# Patient Record
Sex: Female | Born: 1935 | Race: White | Hispanic: No | Smoking: Never smoker
Health system: Southern US, Community
[De-identification: ages and names within clinical notes are randomized; demographics above are authoritative.]

## PROBLEM LIST (undated history)

## (undated) DIAGNOSIS — I10 Essential (primary) hypertension: Secondary | ICD-10-CM

## (undated) DIAGNOSIS — E785 Hyperlipidemia, unspecified: Secondary | ICD-10-CM

## (undated) DIAGNOSIS — C801 Malignant (primary) neoplasm, unspecified: Secondary | ICD-10-CM

## (undated) HISTORY — DX: Hyperlipidemia, unspecified: E78.5

## (undated) HISTORY — PX: KNEE SURGERY: SHX244

## (undated) HISTORY — PX: CARPAL TUNNEL RELEASE: SHX101

## (undated) HISTORY — PX: NEPHRECTOMY: SHX65

## (undated) HISTORY — DX: Essential (primary) hypertension: I10

---

## 2004-12-25 ENCOUNTER — Emergency Department: Payer: Self-pay | Admitting: General Practice

## 2004-12-29 ENCOUNTER — Ambulatory Visit: Payer: Self-pay | Admitting: Urology

## 2006-12-07 IMAGING — CT CT ABD-PELV W/O CM
1 of 2 series · 14 of 32 positions shown, 18 images · non-contrast
Comparison: none

REASON FOR EXAM: (1) abd pain  stone protocol   [HOSPITAL]; (2) abd pain
COMMENTS:

[Series 2: stone · axial · 0.88mm/px · z∈[-528,-72]mm · 14 of 166 slices shown, 18 images]
[im 7/166  soft-tissue]
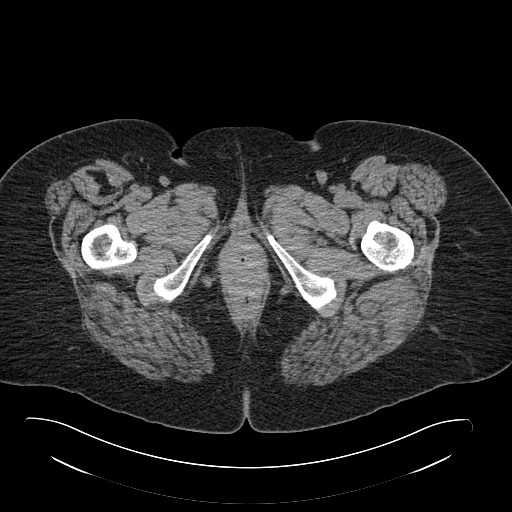
[im 7/166  bone]
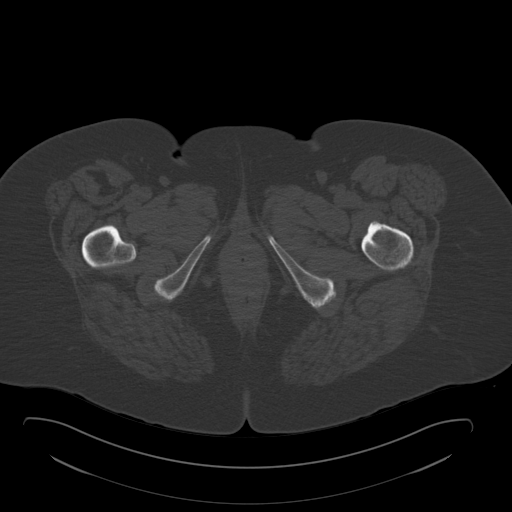
[im 21/166  soft-tissue]
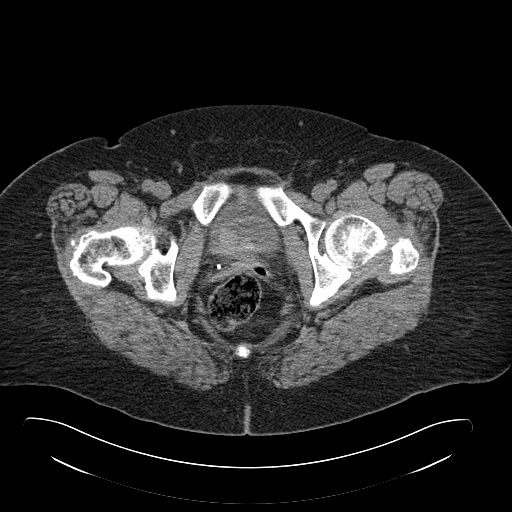
[im 35/166  soft-tissue]
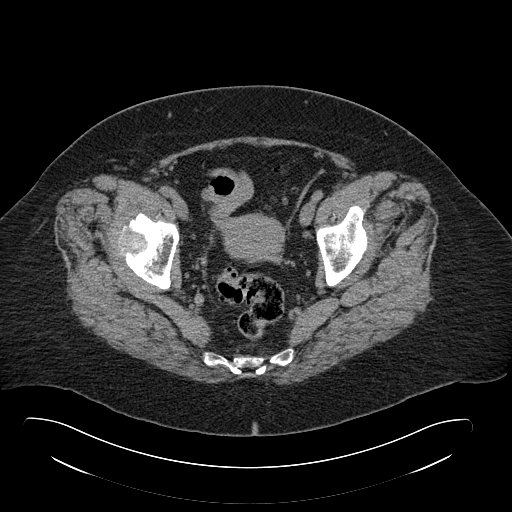
[im 49/166  soft-tissue]
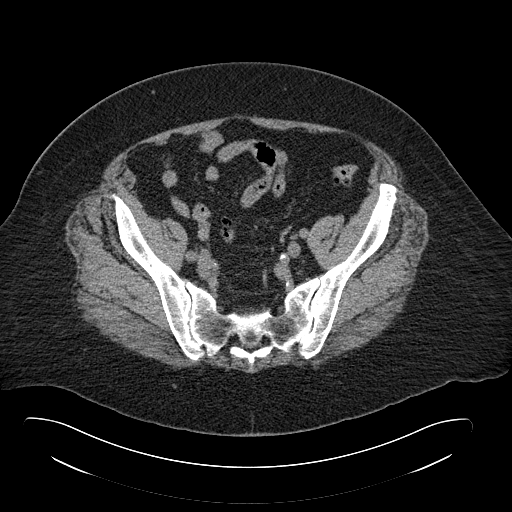
[im 62/166  soft-tissue]
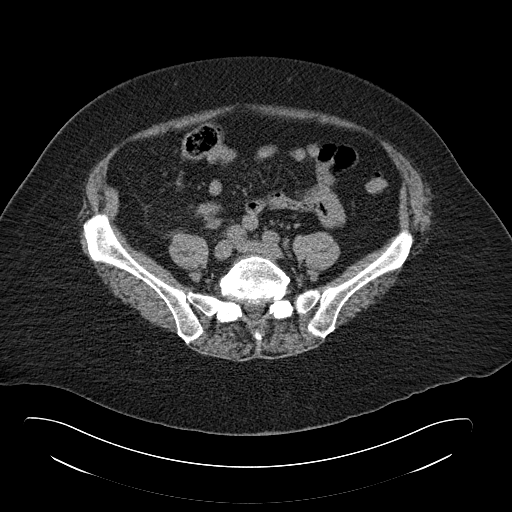
[im 76/166  soft-tissue]
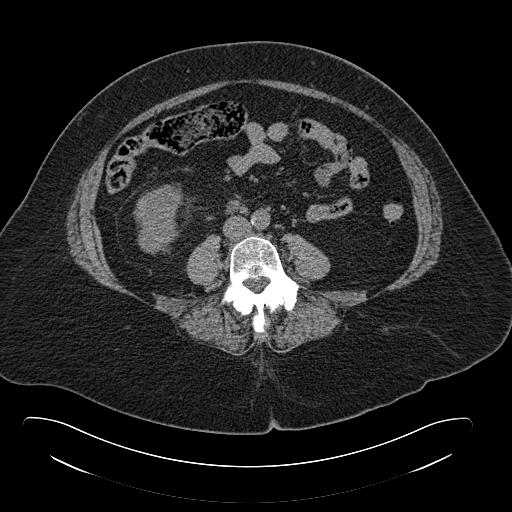
[im 90/166  soft-tissue]
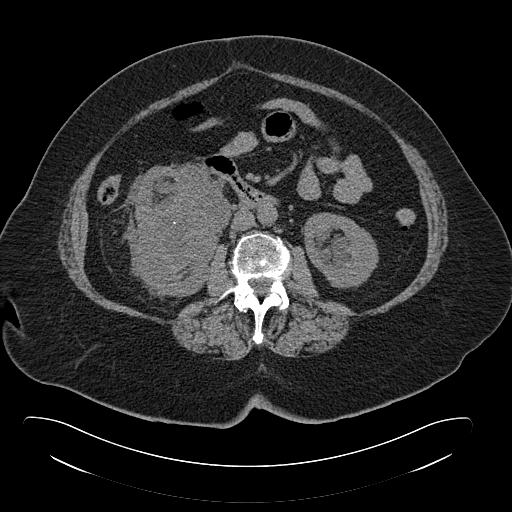
[im 104/166  soft-tissue]
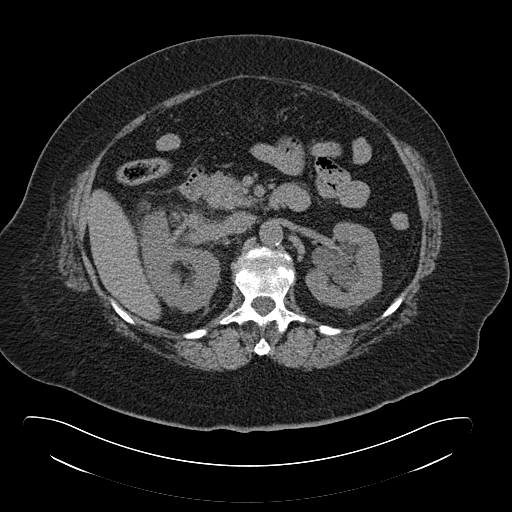
[im 117/166  soft-tissue]
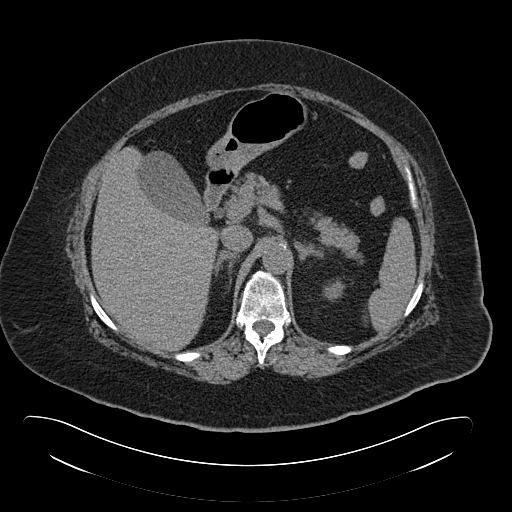
[im 117/166  bone]
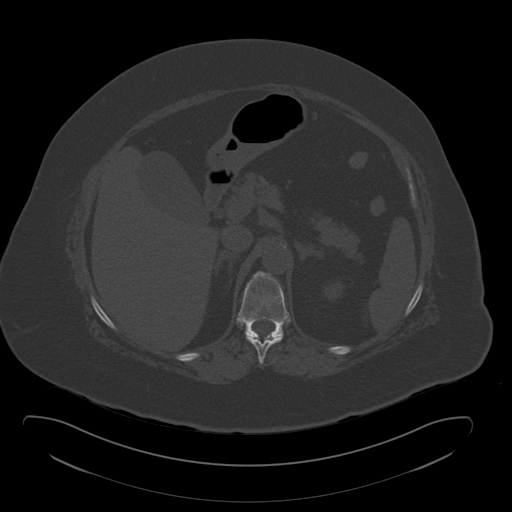
[im 131/166  soft-tissue]
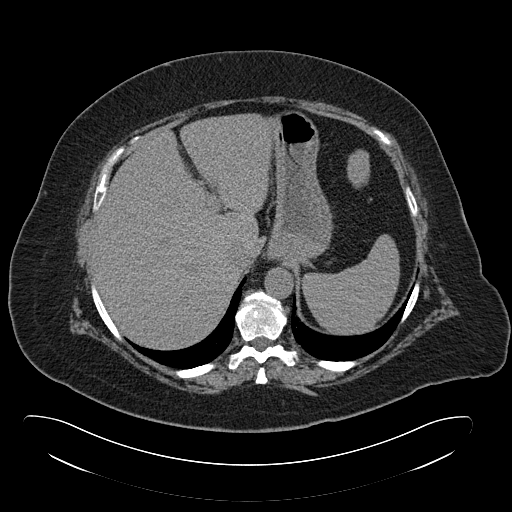
[im 138/166  lung]
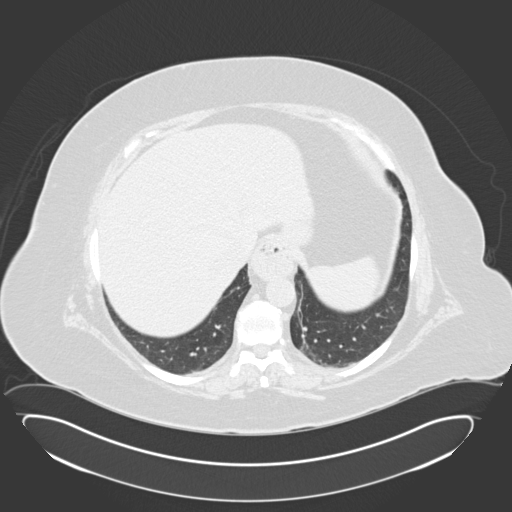
[im 145/166  soft-tissue]
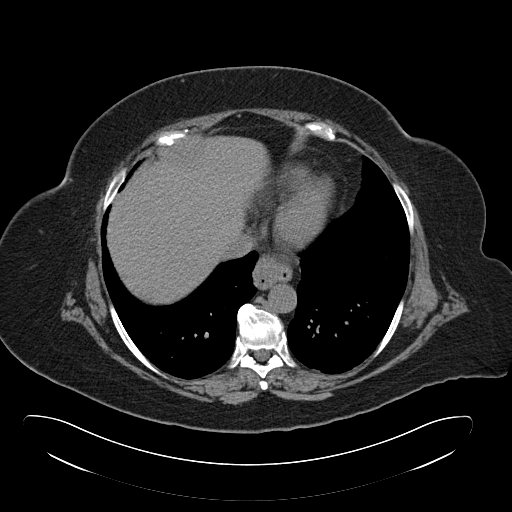
[im 145/166  lung]
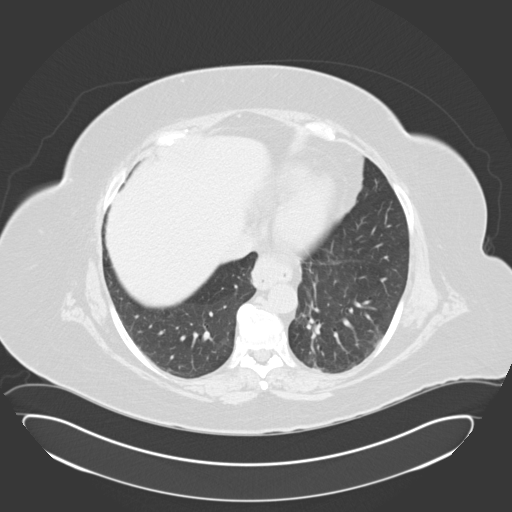
[im 152/166  lung]
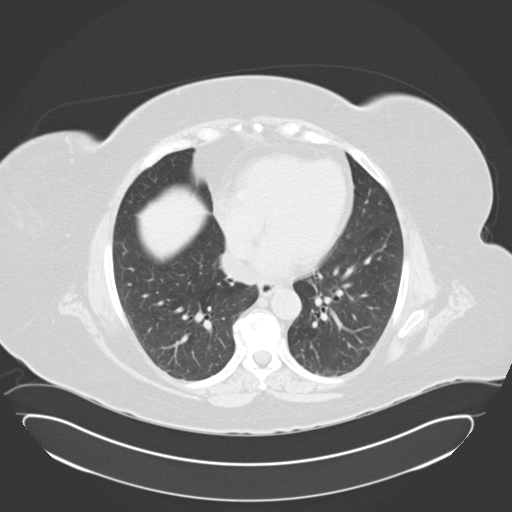
[im 159/166  soft-tissue]
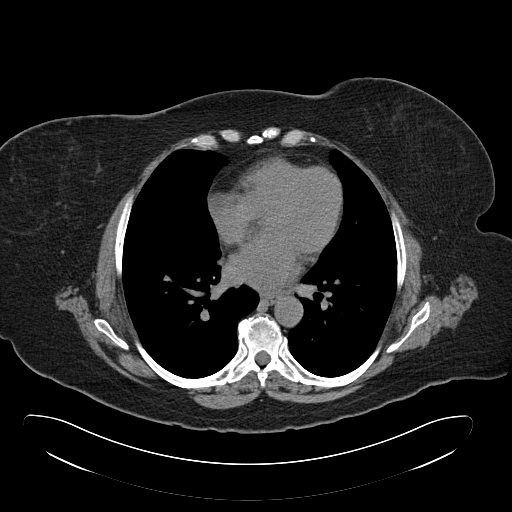
[im 159/166  lung]
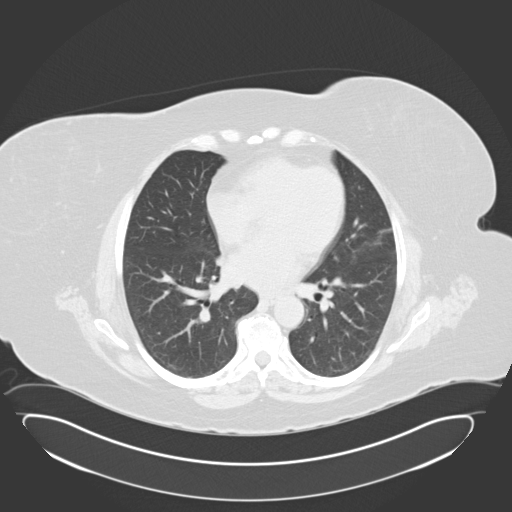

[14 of 32 positions shown; findings below may reference images not displayed]

PROCEDURE:     CT  - CT ABDOMEN AND PELVIS W[DATE]  [DATE]

RESULT:     Non-contrast 3 mm sections were obtained from the lung bases
through the pubic symphysis.

Evaluation of the lung bases demonstrates no evidence of focal infiltrates,
effusions or edema.  Linear areas of increased density project within the
LEFT lung base and paraspinous region likely representing scar or
atelectasis.

Evaluation of the RIGHT kidney demonstrates a large heterogenous mass
extending from the renal pelvis anteriorly. This is in the mid pole region
and this mass appears to measure approximately 8.1 x 9.3 cm in AP by
transverse dimensions. There is mass effect within the renal pelvis as well
as what appears to be moderate hydronephrosis.  A small punctate
calcification is demonstrated within the mass.  The ureter demonstrates no
evidence of hydroureter. There does not appear to be evidence of
ureterolithiasis.  Within the urinary bladder an oblong shaped area of soft
tissue attenuation projects within the bladder suspicious for a mass within
the urinary bladder.  Further evaluation with direct visualization is
recommended.

Evaluation of the LEFT urinary collecting system demonstrates what appears
to be moderate to severe hydronephrosis as well as possibly an element of
peripelvic cyst.  The LEFT ureter demonstrates no evidence of hydroureter.
A distal calcified density projects in the expected course of the LEFT
ureter measuring approximately 4 mm in diameter and suspicious for a distal
LEFT ureteral calculus.  No free fluid nor drainable loculated fluid
collections are demonstrated within the abdomen or pelvis. There is
inflammatory change surrounding the previously described mass within the
RIGHT kidney.  Small retroperitoneal lymph nodes are identified in the
periaortic region.

Non-contrast evaluation of the liver, spleen, pancreas, adrenals are
unremarkable. Evaluation of the pelvis also demonstrates diffuse
diverticulosis within the sigmoid colon without evidence of diverticulitis.
IMPRESSION: 1)Large heterogenous soft tissue mass projecting in the anterior mid pole
region of the RIGHT kidney worrisome for a neoplastic disease until proven
otherwise. Urological consultation is recommended.

2)Soft tissue attenuation projecting in the region of the urinary bladder
also suspicious for a region of neoplastic disease.

3)Distal LEFT ureteral calculus with what appears to be associated moderate
to mildly severe hydronephrosis.

4)There appears to be an area of intermediate attenuation projecting in the
urinary bladder suspicious for a mass.  Further evaluation with direct
visualization is recommended as well as urological consultation for the
previously described findings within the kidneys.

Dr. Broder of the Emergency Department was informed of these findings at
the time of the initial interpretation.

## 2008-10-09 ENCOUNTER — Ambulatory Visit: Payer: Self-pay | Admitting: Unknown Physician Specialty

## 2008-10-15 ENCOUNTER — Ambulatory Visit: Payer: Self-pay | Admitting: Unknown Physician Specialty

## 2010-11-23 ENCOUNTER — Ambulatory Visit: Payer: Self-pay | Admitting: Specialist

## 2010-12-02 ENCOUNTER — Ambulatory Visit: Payer: Self-pay | Admitting: Specialist

## 2011-05-05 ENCOUNTER — Ambulatory Visit: Payer: Self-pay | Admitting: Family Medicine

## 2011-08-14 ENCOUNTER — Ambulatory Visit: Payer: Self-pay | Admitting: General Practice

## 2011-08-14 LAB — URINALYSIS, COMPLETE
Blood: NEGATIVE
Ketone: NEGATIVE
Protein: NEGATIVE
RBC,UR: 1 /HPF (ref 0–5)
Specific Gravity: 1.013 (ref 1.003–1.030)
Squamous Epithelial: <1

## 2011-08-14 LAB — BASIC METABOLIC PANEL
Calcium, Total: 9.4 mg/dL (ref 8.5–10.1)
Chloride: 106 mmol/L (ref 98–107)
Co2: 26 mmol/L (ref 21–32)
EGFR (African American): >60
EGFR (Non-African Amer.): 54 — ABNORMAL LOW
Glucose: 101 mg/dL — ABNORMAL HIGH (ref 65–99)
Potassium: 4.3 mmol/L (ref 3.5–5.1)

## 2011-08-14 LAB — APTT: Activated PTT: 30.1 secs (ref 23.6–35.9)

## 2011-08-14 LAB — CBC
HCT: 38.7 % (ref 35.0–47.0)
MCHC: 33.6 g/dL (ref 32.0–36.0)
Platelet: 168 10*3/uL (ref 150–440)
RDW: 13.3 % (ref 11.5–14.5)
WBC: 6.7 10*3/uL (ref 3.6–11.0)

## 2011-08-14 LAB — PROTIME-INR: Prothrombin Time: 12.1 secs (ref 11.5–14.7)

## 2011-08-14 LAB — SEDIMENTATION RATE: Erythrocyte Sed Rate: 13 mm/hr (ref 0–30)

## 2011-08-16 LAB — URINE CULTURE

## 2011-08-28 ENCOUNTER — Inpatient Hospital Stay: Payer: Self-pay

## 2011-08-29 LAB — BASIC METABOLIC PANEL
Anion Gap: 8 (ref 7–16)
BUN: 14 mg/dL (ref 7–18)
Calcium, Total: 8.4 mg/dL — ABNORMAL LOW (ref 8.5–10.1)
Co2: 24 mmol/L (ref 21–32)
Creatinine: 1.06 mg/dL (ref 0.60–1.30)
EGFR (African American): 58 — ABNORMAL LOW
Glucose: 163 mg/dL — ABNORMAL HIGH (ref 65–99)
Osmolality: 282 (ref 275–301)
Sodium: 139 mmol/L (ref 136–145)

## 2011-08-29 LAB — HEMOGLOBIN: HGB: 10.9 g/dL — ABNORMAL LOW (ref 12.0–16.0)

## 2011-08-30 LAB — BASIC METABOLIC PANEL
Anion Gap: 9 (ref 7–16)
BUN: 11 mg/dL (ref 7–18)
Co2: 23 mmol/L (ref 21–32)
EGFR (African American): >60
EGFR (Non-African Amer.): 57 — ABNORMAL LOW
Osmolality: 273 (ref 275–301)

## 2011-08-30 LAB — HEMOGLOBIN: HGB: 10.4 g/dL — ABNORMAL LOW (ref 12.0–16.0)

## 2011-08-30 LAB — PLATELET COUNT: Platelet: 123 10*3/uL — ABNORMAL LOW (ref 150–440)

## 2012-07-03 LAB — TSH: TSH: 1.48 u[IU]/mL (ref <=5.90)

## 2012-10-05 ENCOUNTER — Encounter (HOSPITAL_COMMUNITY): Payer: Self-pay | Admitting: Emergency Medicine

## 2012-10-05 ENCOUNTER — Emergency Department (HOSPITAL_COMMUNITY): Payer: Medicare Other

## 2012-10-05 ENCOUNTER — Inpatient Hospital Stay (HOSPITAL_COMMUNITY)
Admission: EM | Admit: 2012-10-05 | Discharge: 2012-10-07 | DRG: 563 | Disposition: A | Discharge status: Home / Self Care | Payer: Medicare Other | Attending: General Surgery | Admitting: General Surgery

## 2012-10-05 DIAGNOSIS — Z6841 Body Mass Index (BMI) 40.0 and over, adult: Secondary | ICD-10-CM

## 2012-10-05 DIAGNOSIS — M25552 Pain in left hip: Secondary | ICD-10-CM

## 2012-10-05 DIAGNOSIS — Z905 Acquired absence of kidney: Secondary | ICD-10-CM

## 2012-10-05 DIAGNOSIS — S7000XA Contusion of unspecified hip, initial encounter: Secondary | ICD-10-CM | POA: Diagnosis present

## 2012-10-05 DIAGNOSIS — M25572 Pain in left ankle and joints of left foot: Secondary | ICD-10-CM | POA: Diagnosis present

## 2012-10-05 DIAGNOSIS — I10 Essential (primary) hypertension: Secondary | ICD-10-CM | POA: Diagnosis present

## 2012-10-05 DIAGNOSIS — Z85528 Personal history of other malignant neoplasm of kidney: Secondary | ICD-10-CM

## 2012-10-05 DIAGNOSIS — S301XXA Contusion of abdominal wall, initial encounter: Secondary | ICD-10-CM

## 2012-10-05 DIAGNOSIS — R55 Syncope and collapse: Secondary | ICD-10-CM

## 2012-10-05 DIAGNOSIS — I951 Orthostatic hypotension: Secondary | ICD-10-CM | POA: Diagnosis present

## 2012-10-05 DIAGNOSIS — R110 Nausea: Secondary | ICD-10-CM

## 2012-10-05 DIAGNOSIS — D62 Acute posthemorrhagic anemia: Secondary | ICD-10-CM | POA: Diagnosis present

## 2012-10-05 DIAGNOSIS — T07XXXA Unspecified multiple injuries, initial encounter: Secondary | ICD-10-CM

## 2012-10-05 DIAGNOSIS — R109 Unspecified abdominal pain: Secondary | ICD-10-CM

## 2012-10-05 DIAGNOSIS — S82899A Other fracture of unspecified lower leg, initial encounter for closed fracture: Principal | ICD-10-CM | POA: Diagnosis present

## 2012-10-05 DIAGNOSIS — R0789 Other chest pain: Secondary | ICD-10-CM

## 2012-10-05 DIAGNOSIS — S2000XA Contusion of breast, unspecified breast, initial encounter: Secondary | ICD-10-CM | POA: Diagnosis present

## 2012-10-05 DIAGNOSIS — S82109A Unspecified fracture of upper end of unspecified tibia, initial encounter for closed fracture: Secondary | ICD-10-CM | POA: Diagnosis present

## 2012-10-05 DIAGNOSIS — S2001XA Contusion of right breast, initial encounter: Secondary | ICD-10-CM | POA: Diagnosis present

## 2012-10-05 DIAGNOSIS — S3011XA Contusion of abdominal wall, initial encounter: Secondary | ICD-10-CM

## 2012-10-05 DIAGNOSIS — C801 Malignant (primary) neoplasm, unspecified: Secondary | ICD-10-CM | POA: Diagnosis present

## 2012-10-05 DIAGNOSIS — E785 Hyperlipidemia, unspecified: Secondary | ICD-10-CM | POA: Diagnosis present

## 2012-10-05 DIAGNOSIS — M25562 Pain in left knee: Secondary | ICD-10-CM | POA: Diagnosis present

## 2012-10-05 DIAGNOSIS — N644 Mastodynia: Secondary | ICD-10-CM | POA: Diagnosis present

## 2012-10-05 HISTORY — DX: Malignant (primary) neoplasm, unspecified: C80.1

## 2012-10-05 LAB — BASIC METABOLIC PANEL
CO2: 21 mEq/L (ref 19–32)
Calcium: 9.4 mg/dL (ref 8.4–10.5)
GFR calc Af Amer: 51 mL/min — ABNORMAL LOW (ref >90)
GFR calc non Af Amer: 44 mL/min — ABNORMAL LOW (ref >90)
Sodium: 137 mEq/L (ref 135–145)

## 2012-10-05 LAB — HEPATIC FUNCTION PANEL
ALT: 22 U/L (ref 0–35)
AST: 31 U/L (ref 0–37)
Total Protein: 5.6 g/dL — ABNORMAL LOW (ref 6.0–8.3)

## 2012-10-05 LAB — CBC
MCH: 30.5 pg (ref 26.0–34.0)
MCHC: 33.9 g/dL (ref 30.0–36.0)
Platelets: 189 10*3/uL (ref 150–400)
RBC: 4.3 MIL/uL (ref 3.87–5.11)
RDW: 13.5 % (ref 11.5–15.5)

## 2012-10-05 LAB — POCT I-STAT TROPONIN I: Troponin i, poc: 0.01 ng/mL (ref 0.00–0.08)

## 2012-10-05 LAB — HEMOGLOBIN AND HEMATOCRIT, BLOOD: HCT: 34.1 % — ABNORMAL LOW (ref 36.0–46.0)

## 2012-10-05 MED ORDER — ACETAMINOPHEN 325 MG PO TABS
650.0000 mg | ORAL_TABLET | Freq: Once | ORAL | Status: AC
  Administered 2012-10-05: 650 mg via ORAL
  Filled 2012-10-05: qty 2

## 2012-10-05 MED ORDER — DIAZEPAM 5 MG PO TABS: ORAL_TABLET | ORAL | Status: DC

## 2012-10-05 MED ORDER — TRAMADOL HCL 50 MG PO TABS: 50.0000 mg | ORAL_TABLET | Freq: Four times a day (QID) | ORAL | Status: DC | PRN

## 2012-10-05 MED ORDER — ONDANSETRON HCL 4 MG/2ML IJ SOLN
4.0000 mg | Freq: Once | INTRAMUSCULAR | Status: AC
  Administered 2012-10-05: 4 mg via INTRAVENOUS
  Filled 2012-10-05: qty 2

## 2012-10-05 MED ORDER — SODIUM CHLORIDE 0.9 % IV BOLUS (SEPSIS)
1000.0000 mL | INTRAVENOUS | Status: AC
  Administered 2012-10-05: 1000 mL via INTRAVENOUS

## 2012-10-05 MED ORDER — ONDANSETRON HCL 4 MG PO TABS: 4.0000 mg | ORAL_TABLET | Freq: Three times a day (TID) | ORAL | Status: DC | PRN

## 2012-10-05 MED ORDER — IOHEXOL 300 MG/ML  SOLN
80.0000 mL | Freq: Once | INTRAMUSCULAR | Status: AC | PRN
  Administered 2012-10-05: 80 mL via INTRAVENOUS

## 2012-10-05 NOTE — ED Notes (Signed)
Taken to CT; BP 88/38; this RN accompanying to CT.

## 2012-10-05 NOTE — ED Notes (Signed)
1920  Pt moved from POD D to room C23.  Introduced self to the pt

## 2012-10-05 NOTE — ED Notes (Signed)
Patient ambulated with assist of nurse and son (uses cane at home and did not have here). Reported her walking is usual for her, knee was more sore than usual, but she felt ok. Assisted her back to bed and when she tried to sit down on the bed, her eyes started staring and rolled up, she stopped talking, and went limp.  This lasted about 30 seconds and then she started talking again and states she feels better now that she is lying down again. BP 96/53 once lying down.

## 2012-10-05 NOTE — ED Notes (Signed)
Returned from [place] at this time. Vitals stable. Pt reports feeling better. (see vitals).
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
## 2012-10-05 NOTE — ED Provider Notes (Signed)
9:06 PM Report called to Dr. Onalee Hua (hospitalist).  She will admit the patient.  Patient remains HD stable.  Dr. Dwain Sarna was informed of the admission and the request for consulting services.    Gerhard Munch, MD 10/05/12 2106

## 2012-10-05 NOTE — ED Notes (Addendum)
Pt reported feeling "like she was going to pass out" and was diaphoretic. Vitals taken with BP 79/61 on monitor and 60/40 manually. Notified [place], [place]. NS bolus started. Report no pain in abdomen or back; only pain reported in right hand where a bruise is located. States chest pain is gone at this time.

## 2012-10-05 NOTE — ED Notes (Signed)
Patient transported to CT 
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
## 2012-10-05 NOTE — ED Notes (Signed)
Taken to CT at this time. 
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
## 2012-10-06 ENCOUNTER — Observation Stay (HOSPITAL_COMMUNITY): Payer: Medicare Other

## 2012-10-06 ENCOUNTER — Encounter (HOSPITAL_COMMUNITY): Payer: Self-pay | Admitting: *Deleted

## 2012-10-06 ENCOUNTER — Other Ambulatory Visit (HOSPITAL_COMMUNITY): Payer: Medicare Other

## 2012-10-06 ENCOUNTER — Inpatient Hospital Stay (HOSPITAL_COMMUNITY): Payer: Medicare Other

## 2012-10-06 DIAGNOSIS — M25569 Pain in unspecified knee: Secondary | ICD-10-CM

## 2012-10-06 DIAGNOSIS — I10 Essential (primary) hypertension: Secondary | ICD-10-CM | POA: Diagnosis present

## 2012-10-06 DIAGNOSIS — S301XXA Contusion of abdominal wall, initial encounter: Secondary | ICD-10-CM

## 2012-10-06 DIAGNOSIS — E785 Hyperlipidemia, unspecified: Secondary | ICD-10-CM | POA: Diagnosis present

## 2012-10-06 DIAGNOSIS — D62 Acute posthemorrhagic anemia: Secondary | ICD-10-CM

## 2012-10-06 DIAGNOSIS — S2001XA Contusion of right breast, initial encounter: Secondary | ICD-10-CM | POA: Diagnosis present

## 2012-10-06 DIAGNOSIS — M25572 Pain in left ankle and joints of left foot: Secondary | ICD-10-CM | POA: Diagnosis present

## 2012-10-06 DIAGNOSIS — M25562 Pain in left knee: Secondary | ICD-10-CM | POA: Diagnosis present

## 2012-10-06 DIAGNOSIS — N6489 Other specified disorders of breast: Secondary | ICD-10-CM

## 2012-10-06 DIAGNOSIS — M25579 Pain in unspecified ankle and joints of unspecified foot: Secondary | ICD-10-CM

## 2012-10-06 LAB — CBC
HCT: 30.2 % — ABNORMAL LOW (ref 36.0–46.0)
Hemoglobin: 10.4 g/dL — ABNORMAL LOW (ref 12.0–15.0)
MCHC: 34.4 g/dL (ref 30.0–36.0)

## 2012-10-06 LAB — BASIC METABOLIC PANEL
BUN: 21 mg/dL (ref 6–23)
CO2: 20 mEq/L (ref 19–32)
GFR calc non Af Amer: 46 mL/min — ABNORMAL LOW (ref >90)
Glucose, Bld: 138 mg/dL — ABNORMAL HIGH (ref 70–99)
Potassium: 4.1 mEq/L (ref 3.5–5.1)

## 2012-10-06 MED ORDER — SODIUM CHLORIDE 0.9 % IV SOLN
INTRAVENOUS | Status: DC
  Administered 2012-10-06: 08:00:00 via INTRAVENOUS

## 2012-10-06 MED ORDER — SODIUM CHLORIDE 0.9 % IJ SOLN: 3.0000 mL | INTRAMUSCULAR | Status: DC | PRN

## 2012-10-06 MED ORDER — SODIUM CHLORIDE 0.9 % IV SOLN: 250.0000 mL | INTRAVENOUS | Status: DC | PRN

## 2012-10-06 MED ORDER — SODIUM CHLORIDE 0.9 % IJ SOLN
3.0000 mL | Freq: Two times a day (BID) | INTRAMUSCULAR | Status: DC
  Administered 2012-10-06: 3 mL via INTRAVENOUS

## 2012-10-06 MED ORDER — SODIUM CHLORIDE 0.9 % IV BOLUS (SEPSIS): 500.0000 mL | Freq: Once | INTRAVENOUS | Status: DC

## 2012-10-06 MED ORDER — ACETAMINOPHEN 325 MG PO TABS
650.0000 mg | ORAL_TABLET | Freq: Four times a day (QID) | ORAL | Status: DC | PRN
  Administered 2012-10-06 – 2012-10-07 (×4): 650 mg via ORAL
  Filled 2012-10-06 (×4): qty 2

## 2012-10-06 MED ORDER — SODIUM BICARBONATE BOLUS VIA INFUSION
Freq: Once | INTRAVENOUS | Status: AC
  Administered 2012-10-06: 10:00:00 via INTRAVENOUS
  Filled 2012-10-06: qty 1

## 2012-10-06 MED ORDER — ACETAMINOPHEN 650 MG RE SUPP: 650.0000 mg | Freq: Four times a day (QID) | RECTAL | Status: DC | PRN

## 2012-10-06 MED ORDER — IOHEXOL 350 MG/ML SOLN
50.0000 mL | Freq: Once | INTRAVENOUS | Status: AC | PRN
  Administered 2012-10-06: 50 mL via INTRAVENOUS

## 2012-10-06 MED ORDER — SODIUM BICARBONATE 8.4 % IV SOLN
50.0000 meq | Freq: Once | INTRAVENOUS | Status: DC
  Filled 2012-10-06: qty 50

## 2012-10-06 MED ORDER — SODIUM BICARBONATE 8.4 % IV SOLN
Freq: Once | INTRAVENOUS | Status: AC
  Administered 2012-10-06: 10:00:00 via INTRAVENOUS
  Filled 2012-10-06: qty 500

## 2012-10-06 NOTE — ED Provider Notes (Signed)
Medical screening examination/treatment/procedure(s) were conducted as a shared visit with non-physician practitioner(s) and myself.  I personally evaluated the patient during the encounter   Rolan Bucco, MD 10/06/12 [phone]
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
## 2012-10-06 NOTE — Progress Notes (Signed)
Orthopedic Tech Progress Note Patient Details:  Sharon Cooper 09/25/1930 [id number]  Ortho Devices Type of Ortho Device: CAM walker;Knee Immobilizer Ortho Device/Splint Interventions: Application   Shawnie Pons 10/06/2012, 3:47 PM
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
## 2012-10-07 LAB — CBC
Hemoglobin: 10.1 g/dL — ABNORMAL LOW (ref 12.0–15.0)
MCH: 30.2 pg (ref 26.0–34.0)
MCV: 90.1 fL (ref 78.0–100.0)
Platelets: 134 10*3/uL — ABNORMAL LOW (ref 150–400)
RBC: 3.34 MIL/uL — ABNORMAL LOW (ref 3.87–5.11)

## 2012-10-07 LAB — BASIC METABOLIC PANEL
CO2: 23 mEq/L (ref 19–32)
Calcium: 8.6 mg/dL (ref 8.4–10.5)
Creatinine, Ser: 0.94 mg/dL (ref 0.50–1.10)
Glucose, Bld: 124 mg/dL — ABNORMAL HIGH (ref 70–99)

## 2012-10-07 MED ORDER — ACETAMINOPHEN 325 MG PO TABS: 650.0000 mg | ORAL_TABLET | Freq: Four times a day (QID) | ORAL | Status: AC | PRN
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
## 2012-10-07 NOTE — Progress Notes (Signed)
HHPT/OT arranged with Genevieve Norlander per patient choice.  Pt needs wheelchair and it was ordered and to be delivered by [facility] to patient room.  Have called DME rep, Jill Alexanders, to let him know pt will need that wheelchair either delivered to pt room before d/c or to home prior to her arrival--was only able to leave a voicemail.  Assigned RN aware of all of this.
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
## 2012-10-07 NOTE — Progress Notes (Signed)
Saw order for wheelchair and went in the patient's room to discuss the wheelchair.  Patient stated she is going to be with [place] with home health and someone is seeing about using the DME company associated with [place].  Currently do not have the size wheelchair requested for patient in our closet.  If order for the wheelchair does go through with [facility], the wheelchair would have to be delivered to the patient's hospital room or to the home.    Orlan Leavens

## 2012-10-07 NOTE — Progress Notes (Signed)
Dr. Ernest Pine is her orthopedic surgeon at [facility] and she would like to go back to see him post-discharge.  He is a member of the [employer] for [employer].  Their number is [phone].  I have arranged for her to see at 1:15 p.m. On Thursday.  She will need her X-rays on a disk sent with her.  This patient has been seen and I agree with the findings and treatment plan.  Sharon Cooper. Gae Bon, MD, FACS [phone] (pager) [phone] (direct pager) Trauma Surgeon
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
## 2013-04-04 ENCOUNTER — Telehealth (HOSPITAL_COMMUNITY): Payer: Self-pay | Admitting: Emergency Medicine

## 2013-04-04 NOTE — Telephone Encounter (Signed)
Talked to Linneus from [facility] services.  She said she needs a face to face paper signed by Dr. Hulen Skains for services rendered.  She had faxed a copy to [place] office but has not received confirmation back.  Gave fax number for trauma office.  Needs to be signed by MD, PA's can not sign it.

## 2013-05-05 ENCOUNTER — Telehealth (HOSPITAL_COMMUNITY): Payer: Self-pay | Admitting: Emergency Medicine

## 2013-05-09 NOTE — Telephone Encounter (Signed)
Addressed by Dennard Schaumann.

## 2014-02-09 LAB — HEPATIC FUNCTION PANEL
ALK PHOS: 87 U/L (ref 25–125)
ALT: 15 U/L (ref 7–35)
AST: 14 U/L (ref 13–35)
BILIRUBIN, TOTAL: 0.4 mg/dL

## 2014-02-09 LAB — LIPID PANEL
CHOLESTEROL: 310 mg/dL — AB (ref 0–200)
HDL: 59 mg/dL (ref 35–70)
LDL CALC: 215 mg/dL
LDl/HDL Ratio: 3.6
Triglycerides: 180 mg/dL — AB (ref 40–160)

## 2014-02-09 LAB — BASIC METABOLIC PANEL
BUN: 16 mg/dL (ref 4–21)
CREATININE: 1.1 mg/dL (ref 0.5–1.1)
Glucose: 108 mg/dL
Potassium: 4.8 mmol/L (ref 3.4–5.3)
SODIUM: 141 mmol/L (ref 137–147)

## 2014-02-09 LAB — CBC AND DIFFERENTIAL
HCT: 40 % (ref 36–46)
HEMOGLOBIN: 13.5 g/dL (ref 12.0–16.0)
Neutrophils Absolute: 67 /uL
Platelets: 188 10*3/uL (ref 150–399)
WBC: 5 10^3/mL

## 2014-07-19 NOTE — Op Note (Signed)
PATIENT NAME:  Sharon Cooper, CHRISLEY MR#:  [id number] DATE OF BIRTH:  23-May-1930  DATE OF PROCEDURE:  08/28/2011  PREOPERATIVE DIAGNOSIS: Degenerative arthrosis of the right knee.   POSTOPERATIVE DIAGNOSIS: Degenerative arthrosis of the right knee.   PROCEDURE PERFORMED: Right total knee arthroplasty using computer-assisted navigation.   SURGEON: Laurice Record. Holley Bouche., MD  ASSISTANT: Vance Peper, PA-C (required to maintain retraction throughout the procedure).   ANESTHESIA: Femoral nerve block and spinal.   ESTIMATED BLOOD LOSS: 50 mL.   FLUIDS REPLACED: 2000 mL of crystalloid.   TOURNIQUET TIME: 107 minutes.   DRAINS: Two medium drains to reinfusion system.   SOFT TISSUE RELEASES: Anterior cruciate ligament, posterior cruciate ligament, deep medial collateral ligament, patellofemoral ligament, and posterolateral corner.  IMPLANTS UTILIZED: DePuy PFC Sigma size 4 posterior stabilized femoral component (cemented), size 4 MBT tendon tibial component (cemented), 38 mm three peg oval dome patella (cemented), and a 12.5 mm stabilized rotating platform polyethylene insert.   INDICATIONS FOR SURGERY: The patient is a 81 year old female who has been seen for complaints of progressive right knee pain with valgus deformity. X-rays demonstrated severe degenerative changes in tricompartmental fashion with valgus deformity. After discussion of the risks and benefits of surgical intervention, the patient expressed her understanding of the risks, benefits and agreed with plans for surgical intervention.   PROCEDURE IN DETAIL: Patient was brought into the Operating Room and, after adequate femoral nerve block and spinal anesthesia was achieved, a tourniquet was placed on patient's upper right thigh. Patient's right knee and leg were cleaned and prepped with alcohol and DuraPrep, draped in the usual sterile fashion. A "timeout" was performed as per usual protocol. The right lower extremity was exsanguinated using  Esmarch, and the tourniquet was inflated to 300 mmHg. Anterior longitudinal incision was made followed by a standard mid vastus approach. A large effusion was evacuated. The deep fibers of the medial collateral ligament were elevated in subperiosteal fashion off of the medial flare of the tibia so as to maintain a continuous soft tissue sleeve. Patella was subluxed laterally and the patellofemoral ligament was incised. Inspection of the knee demonstrated severe degenerative changes with evidence of eburnated bone. Prominent osteophytes were debrided using rongeur. Anterior and posterior cruciate ligaments were excised. Two 4.0 mm Schanz pins were inserted into the femur and into the tibia for attachment of the trackers that were used for computer-assisted navigation. Hip center was identified using circumduction technique. Distal landmarks were mapped using computer. Distal femur and proximal tibia were mapped using computer. Distal femoral cutting guide was positioned using computer-assisted navigation so as to achieve a 5 degrees distal valgus cut. Cut was performed and verified using the computer. Distal femur was sized and it was felt that a size 4 femoral component was appropriate. A size 4 cutting guide was positioned and the anterior cut was performed and verified using the computer. This was followed by completion of the posterior and chamfer cuts. The femoral cutting guide for the central box was then positioned and central box cut was performed. Attention was then directed to the proximal tibia. Medial and lateral menisci were excised. The extramedullary tibial cutting guide was positioned using computer-assisted navigation so as to achieve 0 degree varus valgus alignment and 0 degree posterior slope. Cut was performed and verified using the computer. Proximal tibia was sized and it was felt that a size 4 tibial tray was appropriate. Tibial and femoral trials were inserted followed by insertion of a 10 mm  polyethylene insert.  The knee was felt to be tight laterally. Trial components were removed. The knee was placed in extension and the joint was distracted using Moreland retractors. The posterolateral corner was then carefully released using a combination of electrocautery and Metzenbaum scissors. This allowed for good medial and lateral soft tissue balancing. Trial components were inserted followed by first a 10 mm and subsequently a 12.5 mm polyethylene trial. This allowed for excellent mediolateral soft tissue balancing both in extension and in flexion. Finally, the patella was cut and prepared so as to accommodate a 38 mm three peg oval dome patella. Patellar trial was placed and the knee was placed through a range of motion with excellent patellar tracking appreciated.   Femoral trial was removed. Central post hole for the tibial component was reamed followed by insertion of a keel punch. Tibial trials were then removed. Cut surfaces of bone were irrigated with copious amounts of normal saline with antibiotic solution using pulsatile lavage and then suctioned dry. Polymethyl methacrylate cement was prepared in the usual fashion using a vacuum mixer. Cement was applied to the cut surface of the proximal tibia as well as along the undersurface of a size 4 MBT tibial component. Tibial component was positioned and impacted into place. Excess cement was removed using freer elevators. Cement was then applied to the cut surface of the femur as well as along the posterior flanges of a size 4 posterior stabilized femoral component. Femoral component was positioned and impacted into place. Excess cement was removed using freer elevators. A 12.5 mm polyethylene trial was inserted and the knee was brought into full extension with steady axial compression applied. Finally, cement was applied to the backside of a 38 mm three peg oval dome patella and the patellar component was positioned and patellar clamp applied. Excess  cement was removed using freer elevators.   After adequate curing of cement, tourniquet was deflated after total tourniquet time of 107 minutes. Hemostasis was achieved using electrocautery. The knee was irrigated with copious amounts of normal saline with antibiotic solution using pulsatile lavage and then suctioned dry. The knee was inspected for any residual cement debris. 30 mL of 0.25% Marcaine with epinephrine was injected along the posterior capsule. A 12.5 mm stabilized rotating platform polyethylene insert was inserted and the knee was placed through a range of motion. Excellent mediolateral soft tissue balancing was appreciated both in full extension and in 90 degrees of flexion. Two medium Hemovac drains were placed in the wound bed and brought out through a separate stab incision to be attached to reinfusion system. The medial parapatellar portion of the incision was reapproximated using interrupted sutures of #1 Vicryl. The subcutaneous tissue was approximated in layers using first #0 Vicryl followed by 2-0 Vicryl. Skin was closed with skin staples. A sterile dressing was applied.   Patient tolerated the procedure well. She was transported to the recovery room in stable condition.    ____________________________ Laurice Record. Holley Bouche., MD jph:cms D: 08/28/2011 21:22:07 ET T: 08/29/2011 10:45:05 ET JOB#: [id number]  cc: Laurice Record. Holley Bouche., MD, <Dictator> JAMES P Holley Bouche MD ELECTRONICALLY SIGNED 08/29/2011 20:29

## 2014-07-19 NOTE — Discharge Summary (Signed)
PATIENT NAME:  Sharon Cooper, Sharon Cooper MR#:  [id number] DATE OF BIRTH:  [birth year]  DATE OF ADMISSION:  08/28/2011 DATE OF DISCHARGE:  08/31/2011  ADMITTING DIAGNOSIS: Degenerative arthrosis of the right knee.   DISCHARGE DIAGNOSIS: Degenerative arthrosis of the right knee.   HISTORY: The patient is a pleasant 81 year old who has been followed at [facility] for progression of right knee pain. The patient had reported a five to six-year history of progressive right knee pain. She reported crepitus as well as near giving way of the knee. The patient had localized most of the pain along the lateral aspect of the knee. Her pain was noted to be aggravated with weight-bearing activities. She admits to ambulating with a significant limp despite the use of a cane. She was denying any night pain prior to surgery. She denied any gross locking of the knee. She did describe some pain beginning as sharp and stabbing in nature. There had been no significant improvement in her condition despite activity modification and use of ambulatory aid. She was unable to take anti-inflammatory medications secondary to a right nephrectomy in 2007. The patient states that the pain increased significantly to the point that it was interfering with her activities of daily living. X-rays taken in the clinic showed severe narrowing of the lateral cartilage space with associated valgus alignment. Osteophyte formation as well as subchondral sclerosis was noted. There were degenerative changes to the patellofemoral articulation. After discussion of the risks and benefits of surgical intervention, the patient expressed her understanding of the risks and benefits and agreed for plans for surgical intervention.   PROCEDURE: Right total knee arthroplasty using computer-assisted navigation. Anesthesia was femoral nerve block with spinal. Soft tissue release: Anterior cruciate ligament, posterior cruciate ligament, deep medial collateral ligaments as  well as the patellofemoral ligament and the posterior lateral corner. Implants utilized: DePuy PFC Sigma size 4 posterior stabilized femoral component (cemented), size 4 MBT tibial component (cemented), 38 mm three-pegged oval dome patella (cemented), and a 12.5 mm stabilized rotating platform polyethylene insert.   HOSPITAL COURSE: The patient tolerated the procedure very well. She had no complications. She was then taken to the PAC-U where she was stabilized and then transferred to the Orthopedic floor. She began receiving anticoagulation therapy of Lovenox 40 mg b.i.d. per Anesthesia and Pharmacy protocol. She was fitted with TED stockings bilaterally. These were allowed to be removed one hour per eight-hour shift. The right one was applied on day two following removal of the Hemovac and a dressing change. Her calves have been nontender, free of any evidence of any deep venous thromboses. Negative Homans sign. Her ankles and feet were fitted with the AVI compression foot pump set at 80 mmHg bilaterally. Heels were elevated off the bed using rolled towels.   The patient has denied any chest pain or shortness of breath. Vital signs have been stable. She has been afebrile. Hemodynamically she was stable. No transfusions were given other than the Autovac transfusion given the first six hours postoperatively.   Physical therapy was initiated on day one for gait training and transfers, occupational therapy for activities of daily living and assistive devices. She has progressed very nicely. Upon being discharged, she was ambulating greater than 200 feet. She was able to go up and down four sets of steps. She was independent with bed-to-chair transfers.   The patient's IV, Foley and Hemovac were discontinued on day two along with a dressing change. The Polar Care was reapplied to the surgical leg  to maintain a temperature of 40 to 50 degrees Fahrenheit. The wound was free of any drainage or any signs of  infection.   DISPOSITION: The patient is being discharged to home in improved stable condition.   DISCHARGE INSTRUCTIONS:  1. She will continue with weight-bearing as tolerated.  2. Continue to use a walker until cleared by Physical Therapy to go to a quad cane.  3. She will receive Home Health physical therapy.  4. Continue using TED stockings. These are to be worn during the day but may be removed at night.  5. She is to continue using a Polar Care to maintain a temperature of 40 to 50 degrees Fahrenheit. Recommend that she use this around-the-clock as much as possible until she is seen in the office in two weeks.  6. She is placed on a regular diet.  7. She has a follow-up appointment in the Clinic on June 18th with Vance Peper, PA and with Dr. Skip Estimable on July 16th.  8. She is to call the Clinic sooner if any temperatures of 101.5 or greater or excessive bleeding.  9. She was instructed in wound care.   DISCHARGE MEDICATIONS: She is to resume her regular medication that she was on prior to admission. She was given a prescription for Lovenox 40 mg subcutaneously daily for 14 days, then discontinue and begin taking one 81 mg enteric-coated aspirin per day. Also, she was given a prescription for oxycodone 5 to 10 mg every 4 to 6 hours p.r.n. for pain and Ultram 50 to 100 mg every 4 to 6 hours p.r.n. for pain.   PAST MEDICAL HISTORY:  1. Chickenpox.  2. Cancer of right kidney in 2007. 3. Hypertension.  4. Hyperlipidemia.   ____________________________ Vance Peper, PA jrw:cbb D: 08/31/2011 07:57:30 ET T: 08/31/2011 12:13:41 ET JOB#: [id number]  cc: Vance Peper, PA, <Dictator> Kaysen Deal PA ELECTRONICALLY SIGNED 09/01/2011 8:02

## 2014-08-11 DIAGNOSIS — C649 Malignant neoplasm of unspecified kidney, except renal pelvis: Secondary | ICD-10-CM | POA: Insufficient documentation

## 2014-08-11 DIAGNOSIS — I130 Hypertensive heart and chronic kidney disease with heart failure and stage 1 through stage 4 chronic kidney disease, or unspecified chronic kidney disease: Secondary | ICD-10-CM | POA: Insufficient documentation

## 2014-08-11 DIAGNOSIS — E7800 Pure hypercholesterolemia, unspecified: Secondary | ICD-10-CM | POA: Insufficient documentation

## 2014-08-11 DIAGNOSIS — R739 Hyperglycemia, unspecified: Secondary | ICD-10-CM | POA: Insufficient documentation

## 2014-08-11 DIAGNOSIS — Z87442 Personal history of urinary calculi: Secondary | ICD-10-CM | POA: Insufficient documentation

## 2014-08-11 DIAGNOSIS — E559 Vitamin D deficiency, unspecified: Secondary | ICD-10-CM | POA: Insufficient documentation

## 2014-08-11 DIAGNOSIS — R5383 Other fatigue: Secondary | ICD-10-CM | POA: Insufficient documentation

## 2014-09-06 DIAGNOSIS — Z96651 Presence of right artificial knee joint: Secondary | ICD-10-CM | POA: Insufficient documentation

## 2014-09-06 DIAGNOSIS — Z96659 Presence of unspecified artificial knee joint: Secondary | ICD-10-CM | POA: Insufficient documentation

## 2014-10-26 ENCOUNTER — Ambulatory Visit: Payer: Self-pay | Admitting: Family Medicine

## 2014-11-18 ENCOUNTER — Ambulatory Visit: Payer: Self-pay | Admitting: Family Medicine

## 2014-11-26 ENCOUNTER — Ambulatory Visit (INDEPENDENT_AMBULATORY_CARE_PROVIDER_SITE_OTHER): Payer: Medicare Other | Admitting: Family Medicine

## 2014-11-26 ENCOUNTER — Encounter: Payer: Self-pay | Admitting: Family Medicine

## 2014-11-26 VITALS — BP 146/98 | HR 81 | Temp 97.5°F | Resp 16 | Wt 279.4 lb

## 2014-11-26 DIAGNOSIS — E785 Hyperlipidemia, unspecified: Secondary | ICD-10-CM | POA: Insufficient documentation

## 2014-11-26 DIAGNOSIS — E7800 Pure hypercholesterolemia, unspecified: Secondary | ICD-10-CM

## 2014-11-26 DIAGNOSIS — G5603 Carpal tunnel syndrome, bilateral upper limbs: Secondary | ICD-10-CM | POA: Insufficient documentation

## 2014-11-26 DIAGNOSIS — C649 Malignant neoplasm of unspecified kidney, except renal pelvis: Secondary | ICD-10-CM | POA: Insufficient documentation

## 2014-11-26 DIAGNOSIS — M1990 Unspecified osteoarthritis, unspecified site: Secondary | ICD-10-CM | POA: Insufficient documentation

## 2014-11-26 DIAGNOSIS — I10 Essential (primary) hypertension: Secondary | ICD-10-CM | POA: Insufficient documentation

## 2014-11-26 DIAGNOSIS — G5600 Carpal tunnel syndrome, unspecified upper limb: Secondary | ICD-10-CM | POA: Insufficient documentation

## 2014-11-26 MED ORDER — PRAVASTATIN SODIUM 10 MG PO TABS: 10.0000 mg | ORAL_TABLET | Freq: Every day | ORAL | Status: DC
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
## 2014-12-01 ENCOUNTER — Other Ambulatory Visit: Payer: Self-pay | Admitting: Family Medicine

## 2014-12-01 DIAGNOSIS — E7800 Pure hypercholesterolemia, unspecified: Secondary | ICD-10-CM

## 2014-12-01 MED ORDER — PRAVASTATIN SODIUM 10 MG PO TABS: 10.0000 mg | ORAL_TABLET | Freq: Every day | ORAL | Status: DC

## 2014-12-01 MED ORDER — LISINOPRIL 40 MG PO TABS: 40.0000 mg | ORAL_TABLET | Freq: Every day | ORAL | Status: DC

## 2014-12-01 NOTE — Telephone Encounter (Signed)
Done-aa 

## 2014-12-01 NOTE — Telephone Encounter (Signed)
Pt contacted office for refill request on the following medications:  pravastatin (PRAVACHOL) 10 MG tablet and lisinopril (PRINIVIL,ZESTRIL) 40 MG tablet.  [facility].  (pt does not get her Rx mail order).  JD#[phone]/[id number]

## 2015-02-15 ENCOUNTER — Ambulatory Visit: Payer: Medicare Other | Admitting: Family Medicine

## 2015-04-08 ENCOUNTER — Ambulatory Visit: Payer: Medicare Other | Admitting: Family Medicine

## 2015-04-15 ENCOUNTER — Other Ambulatory Visit: Payer: Self-pay

## 2015-05-26 ENCOUNTER — Encounter: Payer: Medicare Other | Admitting: Family Medicine

## 2015-08-31 DIAGNOSIS — Z96651 Presence of right artificial knee joint: Secondary | ICD-10-CM | POA: Diagnosis not present

## 2015-09-16 ENCOUNTER — Other Ambulatory Visit: Payer: Self-pay | Admitting: Family Medicine

## 2015-12-16 ENCOUNTER — Ambulatory Visit (INDEPENDENT_AMBULATORY_CARE_PROVIDER_SITE_OTHER): Payer: Medicare Other | Admitting: Family Medicine

## 2015-12-16 ENCOUNTER — Encounter: Payer: Self-pay | Admitting: Family Medicine

## 2015-12-16 VITALS — BP 148/80 | HR 88 | Temp 97.8°F | Resp 18 | Wt 281.0 lb

## 2015-12-16 DIAGNOSIS — E7800 Pure hypercholesterolemia, unspecified: Secondary | ICD-10-CM | POA: Diagnosis not present

## 2015-12-16 DIAGNOSIS — R739 Hyperglycemia, unspecified: Secondary | ICD-10-CM

## 2015-12-16 DIAGNOSIS — I10 Essential (primary) hypertension: Secondary | ICD-10-CM | POA: Diagnosis not present

## 2015-12-16 MED ORDER — LISINOPRIL 40 MG PO TABS: 40.0000 mg | ORAL_TABLET | Freq: Every day | ORAL | 3 refills | Status: DC
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
## 2016-01-20 ENCOUNTER — Ambulatory Visit: Payer: No Typology Code available for payment source

## 2016-01-21 DIAGNOSIS — E7800 Pure hypercholesterolemia, unspecified: Secondary | ICD-10-CM | POA: Diagnosis not present

## 2016-01-21 DIAGNOSIS — I10 Essential (primary) hypertension: Secondary | ICD-10-CM | POA: Diagnosis not present

## 2016-01-21 DIAGNOSIS — R739 Hyperglycemia, unspecified: Secondary | ICD-10-CM | POA: Diagnosis not present

## 2016-01-22 LAB — COMPREHENSIVE METABOLIC PANEL
A/G RATIO: 1.6 (ref 1.2–2.2)
ALK PHOS: 96 IU/L (ref 39–117)
ALT: 10 IU/L (ref 0–32)
AST: 11 IU/L (ref 0–40)
Albumin: 4.1 g/dL (ref 3.5–4.7)
BILIRUBIN TOTAL: 0.3 mg/dL (ref 0.0–1.2)
BUN/Creatinine Ratio: 15 (ref 12–28)
BUN: 17 mg/dL (ref 8–27)
CO2: 21 mmol/L (ref 18–29)
CREATININE: 1.1 mg/dL — AB (ref 0.57–1.00)
Calcium: 9.7 mg/dL (ref 8.7–10.3)
Chloride: 102 mmol/L (ref 96–106)
GFR, EST AFRICAN AMERICAN: 54 mL/min/{1.73_m2} — AB (ref >59)
GFR, EST NON AFRICAN AMERICAN: 47 mL/min/{1.73_m2} — AB (ref >59)
GLUCOSE: 118 mg/dL — AB (ref 65–99)
Globulin, Total: 2.6 g/dL (ref 1.5–4.5)
Potassium: 5.2 mmol/L (ref 3.5–5.2)
SODIUM: 142 mmol/L (ref 134–144)
Total Protein: 6.7 g/dL (ref 6.0–8.5)

## 2016-01-22 LAB — CBC WITH DIFFERENTIAL/PLATELET
BASOS: 0 %
Basophils Absolute: 0 10*3/uL (ref 0.0–0.2)
EOS (ABSOLUTE): 0.2 10*3/uL (ref 0.0–0.4)
EOS: 2 %
HEMATOCRIT: 41.5 % (ref 34.0–46.6)
Hemoglobin: 14.2 g/dL (ref 11.1–15.9)
Immature Grans (Abs): 0 10*3/uL (ref 0.0–0.1)
Immature Granulocytes: 0 %
LYMPHS ABS: 1.7 10*3/uL (ref 0.7–3.1)
Lymphs: 24 %
MCH: 30 pg (ref 26.6–33.0)
MCHC: 34.2 g/dL (ref 31.5–35.7)
MCV: 88 fL (ref 79–97)
MONOS ABS: 0.6 10*3/uL (ref 0.1–0.9)
Monocytes: 8 %
NEUTROS ABS: 4.6 10*3/uL (ref 1.4–7.0)
Neutrophils: 66 %
Platelets: 218 10*3/uL (ref 150–379)
RBC: 4.73 x10E6/uL (ref 3.77–5.28)
RDW: 13.1 % (ref 12.3–15.4)
WBC: 7.1 10*3/uL (ref 3.4–10.8)

## 2016-01-22 LAB — LIPID PANEL WITH LDL/HDL RATIO
Cholesterol, Total: 297 mg/dL — ABNORMAL HIGH (ref 100–199)
HDL: 59 mg/dL (ref >39)
LDL Calculated: 202 mg/dL — ABNORMAL HIGH (ref 0–99)
LDL/HDL RATIO: 3.4 ratio — AB (ref 0.0–3.2)
Triglycerides: 182 mg/dL — ABNORMAL HIGH (ref 0–149)
VLDL Cholesterol Cal: 36 mg/dL (ref 5–40)

## 2016-01-22 LAB — HEMOGLOBIN A1C
Est. average glucose Bld gHb Est-mCnc: 128 mg/dL
HEMOGLOBIN A1C: 6.1 % — AB (ref 4.8–5.6)

## 2016-01-22 LAB — TSH: TSH: 2.34 u[IU]/mL (ref 0.450–4.500)

## 2016-01-25 ENCOUNTER — Telehealth: Payer: Self-pay

## 2016-01-25 DIAGNOSIS — E7800 Pure hypercholesterolemia, unspecified: Secondary | ICD-10-CM

## 2016-01-25 MED ORDER — PRAVASTATIN SODIUM 10 MG PO TABS: 10.0000 mg | ORAL_TABLET | Freq: Every day | ORAL | 3 refills | Status: DC

## 2016-01-25 NOTE — Telephone Encounter (Signed)
-----   Message from Jerrol Banana., MD sent at 01/25/2016  7:47 AM EDT ----- Labs stable, lipids high but I think patient has been statin intolerant in the past. Recommend she try  the Metamucil daily

## 2016-08-29 DIAGNOSIS — Z96651 Presence of right artificial knee joint: Secondary | ICD-10-CM | POA: Diagnosis not present

## 2016-08-29 DIAGNOSIS — Z6841 Body Mass Index (BMI) 40.0 and over, adult: Secondary | ICD-10-CM | POA: Diagnosis not present

## 2016-09-11 ENCOUNTER — Other Ambulatory Visit: Payer: Self-pay | Admitting: Family Medicine

## 2016-09-11 DIAGNOSIS — I10 Essential (primary) hypertension: Secondary | ICD-10-CM

## 2016-12-21 ENCOUNTER — Telehealth: Payer: Self-pay | Admitting: Family Medicine

## 2016-12-21 DIAGNOSIS — I10 Essential (primary) hypertension: Secondary | ICD-10-CM

## 2016-12-21 MED ORDER — LISINOPRIL 40 MG PO TABS: 40.0000 mg | ORAL_TABLET | Freq: Every day | ORAL | 0 refills | Status: DC

## 2016-12-21 NOTE — Telephone Encounter (Signed)
Pt needs refill on lisinopril 40 mg.  She as not been in since 9/17.  She cant come in next week but made a future appt.  Her call back is [phone]  Con Memos

## 2016-12-21 NOTE — Telephone Encounter (Signed)
She uses [facility] and has appt on 01/01/17

## 2017-01-01 ENCOUNTER — Ambulatory Visit (INDEPENDENT_AMBULATORY_CARE_PROVIDER_SITE_OTHER): Payer: Medicare Other | Admitting: Family Medicine

## 2017-01-01 VITALS — BP 160/86 | HR 68 | Temp 98.2°F | Resp 18 | Wt 278.0 lb

## 2017-01-01 DIAGNOSIS — R739 Hyperglycemia, unspecified: Secondary | ICD-10-CM

## 2017-01-01 DIAGNOSIS — E7800 Pure hypercholesterolemia, unspecified: Secondary | ICD-10-CM | POA: Diagnosis not present

## 2017-01-01 DIAGNOSIS — M791 Myalgia, unspecified site: Secondary | ICD-10-CM | POA: Diagnosis not present

## 2017-01-01 DIAGNOSIS — E1169 Type 2 diabetes mellitus with other specified complication: Secondary | ICD-10-CM | POA: Diagnosis not present

## 2017-01-01 DIAGNOSIS — E785 Hyperlipidemia, unspecified: Secondary | ICD-10-CM | POA: Diagnosis not present

## 2017-01-01 DIAGNOSIS — I10 Essential (primary) hypertension: Secondary | ICD-10-CM | POA: Diagnosis not present

## 2017-01-01 MED ORDER — PRAVASTATIN SODIUM 10 MG PO TABS: 10.0000 mg | ORAL_TABLET | Freq: Every day | ORAL | 3 refills | Status: DC

## 2017-01-01 MED ORDER — LISINOPRIL 40 MG PO TABS
40.0000 mg | ORAL_TABLET | Freq: Every day | ORAL | 3 refills | Status: DC
Start: 2017-01-01 — End: 2017-10-10

## 2017-01-01 MED ORDER — AMLODIPINE BESYLATE 5 MG PO TABS: 5.0000 mg | ORAL_TABLET | Freq: Every day | ORAL | 1 refills | Status: DC
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
## 2017-02-08 ENCOUNTER — Ambulatory Visit: Payer: Self-pay | Admitting: Family Medicine

## 2017-03-01 ENCOUNTER — Ambulatory Visit: Payer: Self-pay | Admitting: Family Medicine

## 2017-08-23 DIAGNOSIS — H524 Presbyopia: Secondary | ICD-10-CM | POA: Diagnosis not present

## 2017-08-30 DIAGNOSIS — Z96651 Presence of right artificial knee joint: Secondary | ICD-10-CM | POA: Diagnosis not present

## 2017-08-30 DIAGNOSIS — Z6841 Body Mass Index (BMI) 40.0 and over, adult: Secondary | ICD-10-CM | POA: Diagnosis not present

## 2017-08-30 DIAGNOSIS — M5442 Lumbago with sciatica, left side: Secondary | ICD-10-CM | POA: Diagnosis not present

## 2017-10-10 ENCOUNTER — Other Ambulatory Visit: Payer: Self-pay | Admitting: Family Medicine

## 2017-10-10 DIAGNOSIS — I10 Essential (primary) hypertension: Secondary | ICD-10-CM

## 2017-10-29 DIAGNOSIS — M7918 Myalgia, other site: Secondary | ICD-10-CM | POA: Diagnosis not present

## 2017-11-05 ENCOUNTER — Ambulatory Visit: Payer: Medicare Other | Admitting: Family Medicine

## 2017-11-05 ENCOUNTER — Ambulatory Visit
Admission: RE | Admit: 2017-11-05 | Discharge: 2017-11-05 | Disposition: A | Discharge status: Home / Self Care | Payer: Medicare Other | Source: Ambulatory Visit | Attending: Family Medicine | Admitting: Family Medicine

## 2017-11-05 VITALS — BP 158/96 | HR 80 | Temp 98.3°F | Resp 18

## 2017-11-05 DIAGNOSIS — M25551 Pain in right hip: Secondary | ICD-10-CM | POA: Diagnosis not present

## 2017-11-05 DIAGNOSIS — M545 Low back pain: Secondary | ICD-10-CM | POA: Diagnosis not present

## 2017-11-05 DIAGNOSIS — M5136 Other intervertebral disc degeneration, lumbar region: Secondary | ICD-10-CM | POA: Diagnosis not present

## 2017-11-05 DIAGNOSIS — M5441 Lumbago with sciatica, right side: Secondary | ICD-10-CM

## 2017-11-05 DIAGNOSIS — M1710 Unilateral primary osteoarthritis, unspecified knee: Secondary | ICD-10-CM

## 2017-11-05 DIAGNOSIS — I10 Essential (primary) hypertension: Secondary | ICD-10-CM

## 2017-11-05 DIAGNOSIS — Z6841 Body Mass Index (BMI) 40.0 and over, adult: Secondary | ICD-10-CM

## 2017-11-05 MED ORDER — METHYLPREDNISOLONE ACETATE 80 MG/ML IJ SUSP: 80.0000 mg | Freq: Once | INTRAMUSCULAR | Status: DC

## 2017-11-05 MED ORDER — CYCLOBENZAPRINE HCL 5 MG PO TABS: 5.0000 mg | ORAL_TABLET | Freq: Three times a day (TID) | ORAL | 0 refills | Status: DC | PRN

## 2017-11-05 NOTE — Patient Instructions (Signed)
Start Aleve twice daily until appointment.
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
## 2017-11-07 ENCOUNTER — Other Ambulatory Visit: Payer: Self-pay

## 2017-11-07 NOTE — Telephone Encounter (Signed)
-----   Message from Jerrol Banana., MD sent at 11/05/2017  3:29 PM EDT ----- DDD and OA

## 2017-11-07 NOTE — Telephone Encounter (Signed)
Try Tramadol 50mg  q 6 hours prn,#60,1refill.

## 2017-11-07 NOTE — Telephone Encounter (Signed)
Advised patient of results. Patient reports that she is still in pain. She reports that the Flexeril has not helped and requesting something different be called in for pain. Please advise. Thanks!

## 2017-11-08 ENCOUNTER — Other Ambulatory Visit: Payer: Self-pay | Admitting: Family Medicine

## 2017-11-08 MED ORDER — HYDROCODONE-ACETAMINOPHEN 5-325 MG PO TABS: 1.0000 | ORAL_TABLET | Freq: Four times a day (QID) | ORAL | 0 refills | Status: DC | PRN

## 2017-11-08 MED ORDER — TRAMADOL HCL 50 MG PO TABS: 50.0000 mg | ORAL_TABLET | Freq: Four times a day (QID) | ORAL | 1 refills | Status: DC | PRN

## 2017-11-08 NOTE — Telephone Encounter (Signed)
Patient has been on Tramodol, Prednisone and Tylenol at one point or anther.  All for this back pain.  Nothing has given her any relief at all.   Patient was doing PT for the opposite side and felt that it caused this side to start hurting.   She state she can't get any relief and would like to get something for the pain.

## 2017-11-08 NOTE — Telephone Encounter (Signed)
Carefuly try Norco5/325--1/2-1 po q 6 hrs prn,#60. May need f/u here next week and/or referral to pain clinic.

## 2017-11-08 NOTE — Telephone Encounter (Signed)
Pt states she is in a lot pf pain in her hip.  She has taken the tramadol before and it does not help her.  The Flexeril does not help.  She wont be able to see Dr. Marry Guan until Oct.    She was just in Monday to see Dr. Rosanna Randy.  She wants to express how much she is in pain and nothing seems to be helping  Please Advise  CB# [phone]  Thanks  Con Memos

## 2017-11-08 NOTE — Telephone Encounter (Signed)
Medication ordered. Please sign. Thanks!

## 2017-11-19 ENCOUNTER — Ambulatory Visit: Payer: Medicare Other | Admitting: Family Medicine

## 2017-11-19 VITALS — BP 160/90 | HR 92 | Temp 98.0°F | Resp 18 | Wt 264.0 lb

## 2017-11-19 DIAGNOSIS — M545 Low back pain: Secondary | ICD-10-CM | POA: Diagnosis not present

## 2017-11-19 DIAGNOSIS — I10 Essential (primary) hypertension: Secondary | ICD-10-CM | POA: Diagnosis not present

## 2017-11-19 DIAGNOSIS — Z6841 Body Mass Index (BMI) 40.0 and over, adult: Secondary | ICD-10-CM

## 2017-11-19 DIAGNOSIS — G8929 Other chronic pain: Secondary | ICD-10-CM

## 2017-11-19 MED ORDER — METOPROLOL SUCCINATE ER 25 MG PO TB24
12.5000 mg | ORAL_TABLET | Freq: Every day | ORAL | 11 refills | Status: DC
Start: 2017-11-19 — End: 2019-01-28
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
## 2018-01-15 ENCOUNTER — Other Ambulatory Visit: Payer: Self-pay | Admitting: Family Medicine

## 2018-01-15 DIAGNOSIS — I10 Essential (primary) hypertension: Secondary | ICD-10-CM

## 2018-01-16 ENCOUNTER — Ambulatory Visit: Payer: Self-pay | Admitting: Family Medicine

## 2018-01-16 NOTE — Progress Notes (Deleted)
Patient: Sharon Cooper Female    DOB: 05-Jun-1930   84 y.o.   MRN: [id number] Visit Date: 01/16/2018  Today's Provider: Wilhemena Durie, MD   No chief complaint on file.  Subjective:    HPI      Hypertension, follow-up:  BP Readings from Last 3 Encounters:  11/19/17 (!) 160/90  11/05/17 (!) 158/96  01/01/17 (!) 160/86    She was last seen for hypertension 2 months ago.  BP at that visit was 160/90. Management changes since that visit include none. She reports {excellent/good/fair/poor:19665} compliance with treatment. She {ACTION; IS/IS [id number]} having side effects. *** She {is/is not:9024} exercising. She {is/is not:9024} adherent to low salt diet.   Outside blood pressures are ***. She is experiencing {Symptoms; cardiac:12860}.  Patient denies {Symptoms; cardiac:12860}.   Cardiovascular risk factors include {cv risk factors:510}.  Use of agents associated with hypertension: {bp agents assoc with hypertension:511::"none"}.     Weight trend: {trend:16658} Wt Readings from Last 3 Encounters:  11/19/17 264 lb (119.7 kg)  01/01/17 278 lb (126.1 kg)  12/16/15 281 lb (127.5 kg)    Current diet: {diet habits:16563}  ------------------------------------------------------------------------   Allergies  Allergen Reactions  . Lactose Intolerance (Gi)   . Atorvastatin Other (See Comments)    severe muscle pain and joint pain severe muscle pain and joint pain  . Pravastatin Sodium Other (See Comments)    muscle cramps, joint pain muscle cramps, joint pain. Per pt she has tolerated this.      Current Outpatient Medications:  .  acetaminophen (TYLENOL) 325 MG tablet, Take 2 tablets (650 mg total) by mouth every 6 (six) hours as needed., Disp: , Rfl:  .  amLODipine (NORVASC) 5 MG tablet, Take 1 tablet (5 mg total) by mouth daily., Disp: 30 tablet, Rfl: 1 .  aspirin EC 81 MG tablet, Take 81 mg by mouth daily., Disp: , Rfl:  .  Cholecalciferol (VITAMIN  D-3) 1000 UNITS CAPS, Take 1 capsule by mouth daily., Disp: , Rfl:  .  cyanocobalamin 100 MCG tablet, Take 1 tablet by mouth daily., Disp: , Rfl:  .  cyclobenzaprine (FLEXERIL) 5 MG tablet, Take 1 tablet (5 mg total) by mouth 3 (three) times daily as needed for muscle spasms. (Patient not taking: Reported on 11/19/2017), Disp: 90 tablet, Rfl: 0 .  HYDROcodone-acetaminophen (NORCO/VICODIN) 5-325 MG tablet, Take 1 tablet by mouth every 6 (six) hours as needed for moderate pain. (Patient not taking: Reported on 11/19/2017), Disp: 60 tablet, Rfl: 0 .  lisinopril (PRINIVIL,ZESTRIL) 40 MG tablet, TAKE ONE TABLET BY MOUTH EVERY DAY, Disp: 90 tablet, Rfl: 3 .  metoprolol succinate (TOPROL-XL) 25 MG 24 hr tablet, Take 0.5 tablets (12.5 mg total) by mouth daily., Disp: 30 tablet, Rfl: 11 .  Omega-3 Fatty Acids (FISH OIL BURP-LESS) 1000 MG CAPS, Take 1 tablet by mouth daily., Disp: , Rfl:  .  pravastatin (PRAVACHOL) 10 MG tablet, Take 1 tablet (10 mg total) by mouth daily., Disp: 90 tablet, Rfl: 3 .  traMADol (ULTRAM) 50 MG tablet, Take 1 tablet (50 mg total) by mouth every 6 (six) hours as needed. (Patient not taking: Reported on 11/19/2017), Disp: 60 tablet, Rfl: 1  Current Facility-Administered Medications:  .  methylPREDNISolone acetate (DEPO-MEDROL) injection 80 mg, 80 mg, Intramuscular, Once, Jerrol Banana., MD  Review of Systems  Social History   Tobacco Use  . Smoking status: Never Smoker  . Smokeless tobacco: Never Used  Substance Use Topics  .  Alcohol use: No   Objective:   There were no vitals taken for this visit. There were no vitals filed for this visit.   Physical Exam      Assessment & Plan:           Wilhemena Durie, MD  [facility]

## 2018-01-18 ENCOUNTER — Other Ambulatory Visit: Payer: Self-pay | Admitting: Family Medicine

## 2018-01-18 DIAGNOSIS — E7800 Pure hypercholesterolemia, unspecified: Secondary | ICD-10-CM

## 2018-01-18 DIAGNOSIS — E1169 Type 2 diabetes mellitus with other specified complication: Secondary | ICD-10-CM

## 2018-01-18 DIAGNOSIS — E785 Hyperlipidemia, unspecified: Secondary | ICD-10-CM

## 2018-01-30 ENCOUNTER — Ambulatory Visit: Payer: Medicare Other | Admitting: Family Medicine

## 2018-01-30 VITALS — BP 142/80 | HR 80 | Temp 98.1°F | Resp 18 | Wt 268.0 lb

## 2018-01-30 DIAGNOSIS — R5383 Other fatigue: Secondary | ICD-10-CM

## 2018-01-30 DIAGNOSIS — I10 Essential (primary) hypertension: Secondary | ICD-10-CM | POA: Diagnosis not present

## 2018-01-30 DIAGNOSIS — C649 Malignant neoplasm of unspecified kidney, except renal pelvis: Secondary | ICD-10-CM

## 2018-01-30 DIAGNOSIS — M5432 Sciatica, left side: Secondary | ICD-10-CM

## 2018-01-30 DIAGNOSIS — M791 Myalgia, unspecified site: Secondary | ICD-10-CM | POA: Diagnosis not present

## 2018-01-30 DIAGNOSIS — E7800 Pure hypercholesterolemia, unspecified: Secondary | ICD-10-CM | POA: Diagnosis not present

## 2018-01-30 DIAGNOSIS — M5431 Sciatica, right side: Secondary | ICD-10-CM

## 2018-01-30 DIAGNOSIS — M1710 Unilateral primary osteoarthritis, unspecified knee: Secondary | ICD-10-CM

## 2018-01-30 DIAGNOSIS — Z6841 Body Mass Index (BMI) 40.0 and over, adult: Secondary | ICD-10-CM

## 2018-01-30 DIAGNOSIS — E66813 Obesity, class 3: Secondary | ICD-10-CM
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
## 2018-01-31 LAB — COMPREHENSIVE METABOLIC PANEL
A/G RATIO: 1.8 (ref 1.2–2.2)
ALBUMIN: 4.3 g/dL (ref 3.5–4.7)
ALK PHOS: 79 IU/L (ref 39–117)
ALT: 9 IU/L (ref 0–32)
AST: 12 IU/L (ref 0–40)
BUN / CREAT RATIO: 21 (ref 12–28)
BUN: 24 mg/dL (ref 8–27)
CHLORIDE: 104 mmol/L (ref 96–106)
CO2: 18 mmol/L — AB (ref 20–29)
CREATININE: 1.16 mg/dL — AB (ref 0.57–1.00)
Calcium: 9.6 mg/dL (ref 8.7–10.3)
GFR calc Af Amer: 50 mL/min/{1.73_m2} — ABNORMAL LOW (ref >59)
GFR calc non Af Amer: 43 mL/min/{1.73_m2} — ABNORMAL LOW (ref >59)
GLOBULIN, TOTAL: 2.4 g/dL (ref 1.5–4.5)
Glucose: 77 mg/dL (ref 65–99)
POTASSIUM: 4 mmol/L (ref 3.5–5.2)
SODIUM: 141 mmol/L (ref 134–144)
Total Protein: 6.7 g/dL (ref 6.0–8.5)

## 2018-01-31 LAB — CBC WITH DIFFERENTIAL/PLATELET
Basophils Absolute: 0 10*3/uL (ref 0.0–0.2)
Basos: 1 %
EOS (ABSOLUTE): 0.2 10*3/uL (ref 0.0–0.4)
EOS: 3 %
HEMATOCRIT: 41.1 % (ref 34.0–46.6)
Hemoglobin: 13.6 g/dL (ref 11.1–15.9)
Immature Grans (Abs): 0 10*3/uL (ref 0.0–0.1)
Immature Granulocytes: 0 %
Lymphocytes Absolute: 1.6 10*3/uL (ref 0.7–3.1)
Lymphs: 20 %
MCH: 29.5 pg (ref 26.6–33.0)
MCHC: 33.1 g/dL (ref 31.5–35.7)
MCV: 89 fL (ref 79–97)
Monocytes Absolute: 0.7 10*3/uL (ref 0.1–0.9)
Monocytes: 8 %
Neutrophils Absolute: 5.5 10*3/uL (ref 1.4–7.0)
Neutrophils: 68 %
Platelets: 190 10*3/uL (ref 150–450)
RBC: 4.61 x10E6/uL (ref 3.77–5.28)
RDW: 12.8 % (ref 12.3–15.4)
WBC: 8 10*3/uL (ref 3.4–10.8)

## 2018-01-31 LAB — LIPID PANEL WITH LDL/HDL RATIO
CHOLESTEROL TOTAL: 247 mg/dL — AB (ref 100–199)
HDL: 59 mg/dL (ref >39)
LDL Calculated: 135 mg/dL — ABNORMAL HIGH (ref 0–99)
LDl/HDL Ratio: 2.3 ratio (ref 0.0–3.2)
TRIGLYCERIDES: 267 mg/dL — AB (ref 0–149)
VLDL Cholesterol Cal: 53 mg/dL — ABNORMAL HIGH (ref 5–40)

## 2018-01-31 LAB — CK: CK TOTAL: 75 U/L (ref 24–173)

## 2018-01-31 LAB — TSH: TSH: 1.33 u[IU]/mL (ref 0.450–4.500)

## 2018-02-04 ENCOUNTER — Telehealth: Payer: Self-pay

## 2018-02-04 NOTE — Telephone Encounter (Signed)
-----   Message from Jerrol Banana., MD sent at 02/02/2018 11:38 AM EST ----- Labs okay.  Lipids better.

## 2018-02-04 NOTE — Telephone Encounter (Signed)
Tried calling patient, no answer. Will try again later.  

## 2018-02-06 NOTE — Telephone Encounter (Signed)
Advised patient of results.  

## 2018-07-24 ENCOUNTER — Ambulatory Visit: Payer: Self-pay | Admitting: Family Medicine

## 2018-09-03 DIAGNOSIS — M1711 Unilateral primary osteoarthritis, right knee: Secondary | ICD-10-CM | POA: Diagnosis not present

## 2018-09-03 DIAGNOSIS — Z96651 Presence of right artificial knee joint: Secondary | ICD-10-CM | POA: Diagnosis not present

## 2019-01-27 ENCOUNTER — Other Ambulatory Visit: Payer: Self-pay | Admitting: Family Medicine

## 2019-01-27 DIAGNOSIS — I10 Essential (primary) hypertension: Secondary | ICD-10-CM

## 2019-01-27 NOTE — Telephone Encounter (Signed)
Pt contacted office for refill request on the following medications:  1. lisinopril (PRINIVIL,ZESTRIL) 40 MG tablet 2. metoprolol succinate (TOPROL-XL) 25 MG 24 hr tablet  [facility] Please advise. Thanks TNP

## 2019-01-27 NOTE — Telephone Encounter (Signed)
patient has not had visit since 01/30/18, left message for patient to call back to schedule follow up visit prior to refill. KW

## 2019-01-28 MED ORDER — METOPROLOL SUCCINATE ER 25 MG PO TB24: 12.5000 mg | ORAL_TABLET | Freq: Every day | ORAL | 0 refills | Status: DC

## 2019-01-28 MED ORDER — LISINOPRIL 40 MG PO TABS: 40.0000 mg | ORAL_TABLET | Freq: Every day | ORAL | 0 refills | Status: DC

## 2019-01-28 NOTE — Telephone Encounter (Signed)
She can have 1 month of refills--thx

## 2019-01-28 NOTE — Telephone Encounter (Signed)
Virtual visit or phone visit ok for now.

## 2019-01-28 NOTE — Telephone Encounter (Signed)
You do not have any openings until 11/30. Would you like to work the patient in? Patient will run out of medication before then. Please advise. Thanks!

## 2019-01-28 NOTE — Telephone Encounter (Signed)
The patient was contacted and is completely out of her Metoprolol and has 1 week of her lisinopril left. She is refusing to come in to the office due to fear of getting sick with Covid and was very Upset about being asked to come in with fears of her age and getting the Virus. Please advise if she is able to do a virtual visit for this.

## 2019-02-24 ENCOUNTER — Encounter: Payer: Self-pay | Admitting: Family Medicine

## 2019-02-24 ENCOUNTER — Ambulatory Visit (INDEPENDENT_AMBULATORY_CARE_PROVIDER_SITE_OTHER): Payer: Medicare Other | Admitting: Family Medicine

## 2019-02-24 VITALS — Ht 66.0 in | Wt 269.0 lb

## 2019-02-24 DIAGNOSIS — E7800 Pure hypercholesterolemia, unspecified: Secondary | ICD-10-CM

## 2019-02-24 DIAGNOSIS — Z6841 Body Mass Index (BMI) 40.0 and over, adult: Secondary | ICD-10-CM

## 2019-02-24 DIAGNOSIS — I10 Essential (primary) hypertension: Secondary | ICD-10-CM

## 2019-02-24 DIAGNOSIS — M1710 Unilateral primary osteoarthritis, unspecified knee: Secondary | ICD-10-CM

## 2019-02-24 NOTE — Progress Notes (Signed)
Patient: Sharon Cooper Female    DOB: Jan 27, 1931   85 y.o.   MRN: [id number] Visit Date: 02/24/2019  Today's Provider: Wilhemena Durie, MD   Chief Complaint  Patient presents with  . Medication Refill   Subjective:     Virtual Visit via Telephone Note  I connected with Wilhelmina Mcardle on 02/24/19 at  3:40 PM EST by telephone and verified that I am speaking with the correct person using two identifiers.  Location: Patient: Home Provider: Office   I discussed the limitations, risks, security and privacy concerns of performing an evaluation and management service by telephone and the availability of in person appointments. I also discussed with the patient that there may be a patient responsible charge related to this service. The patient expressed understanding and agreed to proceed. Patient is feeling well.  She is walking around the house with a walker.  She is not really leaving her house.  Her son mainly takes care of her and her daughter is available for her.  She is having no complaints other than arthritic pains.  She is having no falls.  No chest pain or shortness of breath.  She is taking her meds daily and taking her blood pressure once or twice a week.  Usually runs 140s over 70s HPI  Allergies  Allergen Reactions  . Lactose Intolerance (Gi)   . Atorvastatin Other (See Comments)    severe muscle pain and joint pain severe muscle pain and joint pain  . Milk Protein Diarrhea  . Pravastatin Sodium Other (See Comments)    muscle cramps, joint pain muscle cramps, joint pain. Per pt she has tolerated this.      Current Outpatient Medications:  .  amLODipine (NORVASC) 5 MG tablet, Take 1 tablet (5 mg total) by mouth daily., Disp: 30 tablet, Rfl: 1 .  Ascorbic Acid (VITAMIN C) 100 MG tablet, Take 100 mg by mouth daily., Disp: , Rfl:  .  aspirin EC 81 MG tablet, Take 81 mg by mouth daily., Disp: , Rfl:  .  Cholecalciferol (VITAMIN D-3) 1000 UNITS CAPS, Take 1  capsule by mouth daily., Disp: , Rfl:  .  lisinopril (ZESTRIL) 40 MG tablet, Take 1 tablet (40 mg total) by mouth daily., Disp: 30 tablet, Rfl: 0 .  metoprolol succinate (TOPROL-XL) 25 MG 24 hr tablet, Take 0.5 tablets (12.5 mg total) by mouth daily., Disp: 30 tablet, Rfl: 0 .  acetaminophen (TYLENOL) 325 MG tablet, Take 2 tablets (650 mg total) by mouth every 6 (six) hours as needed., Disp: , Rfl:  .  cyanocobalamin 100 MCG tablet, Take 1 tablet by mouth daily., Disp: , Rfl:  .  Omega-3 Fatty Acids (FISH OIL BURP-LESS) 1000 MG CAPS, Take 1 tablet by mouth daily., Disp: , Rfl:  .  pravastatin (PRAVACHOL) 10 MG tablet, TAKE ONE TABLET BY MOUTH EVERY DAY (Patient not taking: Reported on 02/24/2019), Disp: 90 tablet, Rfl: 1  Current Facility-Administered Medications:  .  methylPREDNISolone acetate (DEPO-MEDROL) injection 80 mg, 80 mg, Intramuscular, Once, Jerrol Banana., MD  Review of Systems  Constitutional: Negative.   Eyes: Negative.   Respiratory: Negative.   Cardiovascular: Negative.   Endocrine: Negative.   Musculoskeletal: Positive for arthralgias.  Allergic/Immunologic: Negative.   Psychiatric/Behavioral: Negative.     Social History   Tobacco Use  . Smoking status: Never Smoker  . Smokeless tobacco: Never Used  Substance Use Topics  . Alcohol use: No  Objective:   Ht 5\' 6"  (1.676 m)   Wt 269 lb (122 kg)   BMI 43.42 kg/m  Vitals:   02/24/19 1541  Weight: 269 lb (122 kg)  Height: 5\' 6"  (1.676 m)  Body mass index is 43.42 kg/m.   Physical Exam   No results found for any visits on 02/24/19.     Assessment & Plan     1. Essential hypertension Fairly well controlled.  Patient does not want to make any adjustments now.  I would increase her atenolol if need be in the future.  2. Primary osteoarthritis of knee, unspecified laterality Patient walking with a walker and is steady with no falls.  3. Hypercholesteremia On pravastatin.  We will recheck  labs on next visit  4. Class 3 severe obesity due to excess calories with serious comorbidity and body mass index (BMI) of 45.0 to 49.9 in adult [facility]) With hypertension arthritis and hyperlipidemia  I discussed the assessment and treatment plan with the patient. The patient was provided an opportunity to ask questions and all were answered. The patient agreed with the plan and demonstrated an understanding of the instructions.   The patient was advised to call back or seek an in-person evaluation if the symptoms worsen or if the condition fails to improve as anticipated.  I provided 11 minutes of non-face-to-face time during this encounter.      Richard Cranford Mon, MD  [facility]

## 2019-02-25 ENCOUNTER — Telehealth: Payer: Self-pay

## 2019-02-25 MED ORDER — METOPROLOL SUCCINATE ER 25 MG PO TB24: 12.5000 mg | ORAL_TABLET | Freq: Every day | ORAL | 5 refills | Status: DC

## 2019-02-25 MED ORDER — LISINOPRIL 40 MG PO TABS: 40.0000 mg | ORAL_TABLET | Freq: Every day | ORAL | 0 refills | Status: DC

## 2019-02-25 MED ORDER — AMLODIPINE BESYLATE 5 MG PO TABS: 5.0000 mg | ORAL_TABLET | Freq: Every day | ORAL | 5 refills | Status: DC

## 2019-02-25 NOTE — Telephone Encounter (Signed)
Copied from [place] [id number]. Topic: Appointment Scheduling - Scheduling Inquiry for Clinic >> Feb 24, 2019  5:08 PM Percell Belt A wrote: Reason for CRM: pt called in about 5pm and state that she was suppose to have a visit with Dr Rosanna Randy today but she never rec'd a call.  She would like to know what happened or she is confused as to what happen with the visit ?  Best number [phone]

## 2019-02-25 NOTE — Telephone Encounter (Signed)
Done

## 2019-04-17 ENCOUNTER — Other Ambulatory Visit: Payer: Self-pay | Admitting: Family Medicine

## 2019-04-17 DIAGNOSIS — E1169 Type 2 diabetes mellitus with other specified complication: Secondary | ICD-10-CM

## 2019-04-17 DIAGNOSIS — E7800 Pure hypercholesterolemia, unspecified: Secondary | ICD-10-CM

## 2019-04-17 DIAGNOSIS — I10 Essential (primary) hypertension: Secondary | ICD-10-CM

## 2019-04-17 MED ORDER — METOPROLOL SUCCINATE ER 25 MG PO TB24: 12.5000 mg | ORAL_TABLET | Freq: Every day | ORAL | 1 refills | Status: DC

## 2019-04-17 MED ORDER — LISINOPRIL 40 MG PO TABS: 40.0000 mg | ORAL_TABLET | Freq: Every day | ORAL | 1 refills | Status: DC

## 2019-04-17 MED ORDER — PRAVASTATIN SODIUM 10 MG PO TABS: 10.0000 mg | ORAL_TABLET | Freq: Every day | ORAL | 3 refills | Status: DC

## 2019-04-17 NOTE — Telephone Encounter (Signed)
Medication Refill - Medication:  lisinopril (ZESTRIL) 40 MG tablet [id number]  metoprolol succinate (TOPROL-XL) 25 MG 24 hr tablet pravastatin (PRAVACHOL) 10 MG tablet [id number]   Has the patient contacted their pharmacy? No. (Agent: If no, request that the patient contact the pharmacy for the refill.) (Agent: If yes, when and what did the pharmacy advise?)  Preferred Pharmacy (with phone number or street name): [facility]   Agent: Please be advised that RX refills may take up to 3 business days. We ask that you follow-up with your pharmacy.

## 2019-04-18 ENCOUNTER — Telehealth: Payer: Self-pay

## 2019-04-18 NOTE — Telephone Encounter (Signed)
Copied from [place] [phone]. Topic: General - Call Back - No Documentation >> Apr 18, 2019  4:06 PM Sharon Cooper wrote: Reason for CRM:   Pt states she is returning a missed call from the office.

## 2019-04-21 NOTE — Telephone Encounter (Signed)
No documentation of anyone in office calling patient.

## 2019-08-28 DIAGNOSIS — Z96651 Presence of right artificial knee joint: Secondary | ICD-10-CM | POA: Diagnosis not present

## 2019-10-08 ENCOUNTER — Other Ambulatory Visit: Payer: Self-pay | Admitting: Family Medicine

## 2019-10-08 DIAGNOSIS — I10 Essential (primary) hypertension: Secondary | ICD-10-CM

## 2019-10-18 IMAGING — CR DG LUMBAR SPINE COMPLETE 4+V
1 series · 5 of 5 positions shown · non-contrast
Comparison: CT abdomen pelvis 10/05/2012

CLINICAL DATA: Acute low back pain

EXAM:
LUMBAR SPINE - COMPLETE 4+ VIEW

[Series 1: dg lumbar spine complete 4 +v · 0.14mm/px · 5 of 5 slices shown]
[im 1/5]
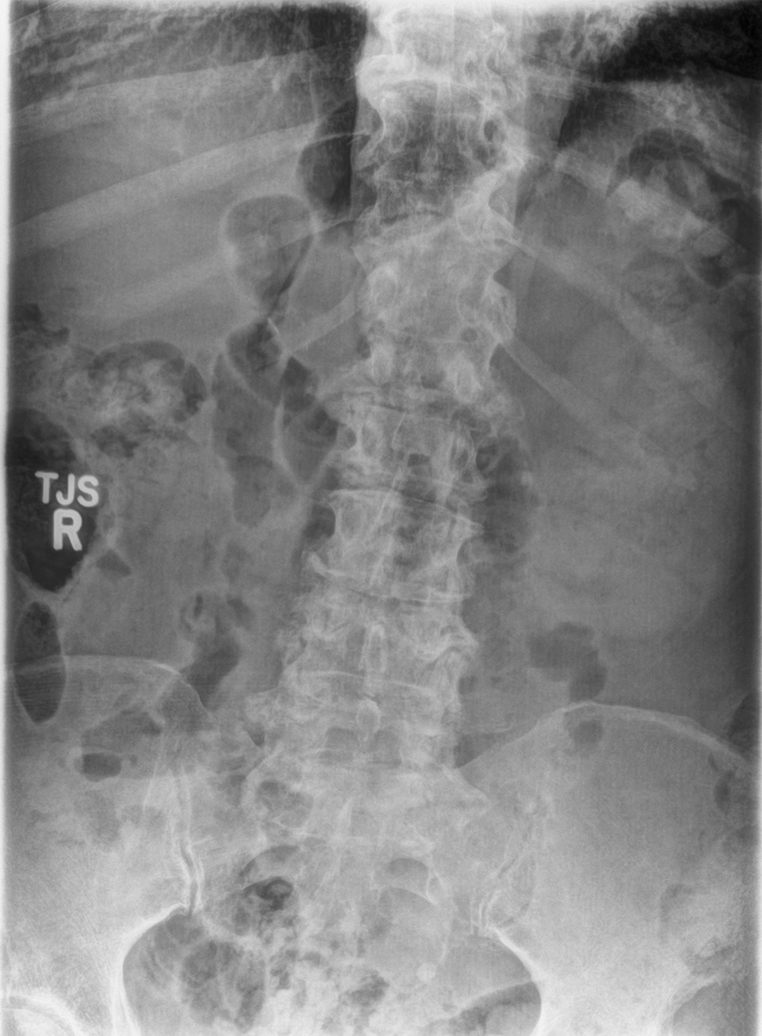
[im 2/5]
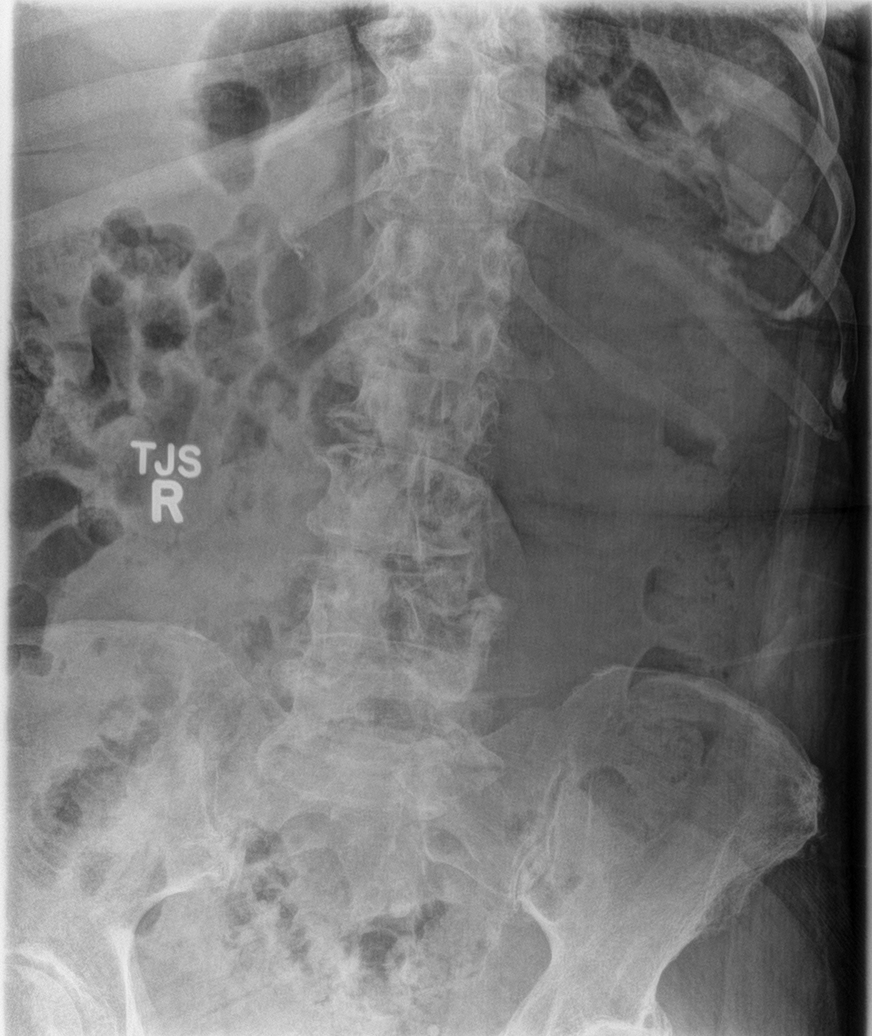
[im 3/5]
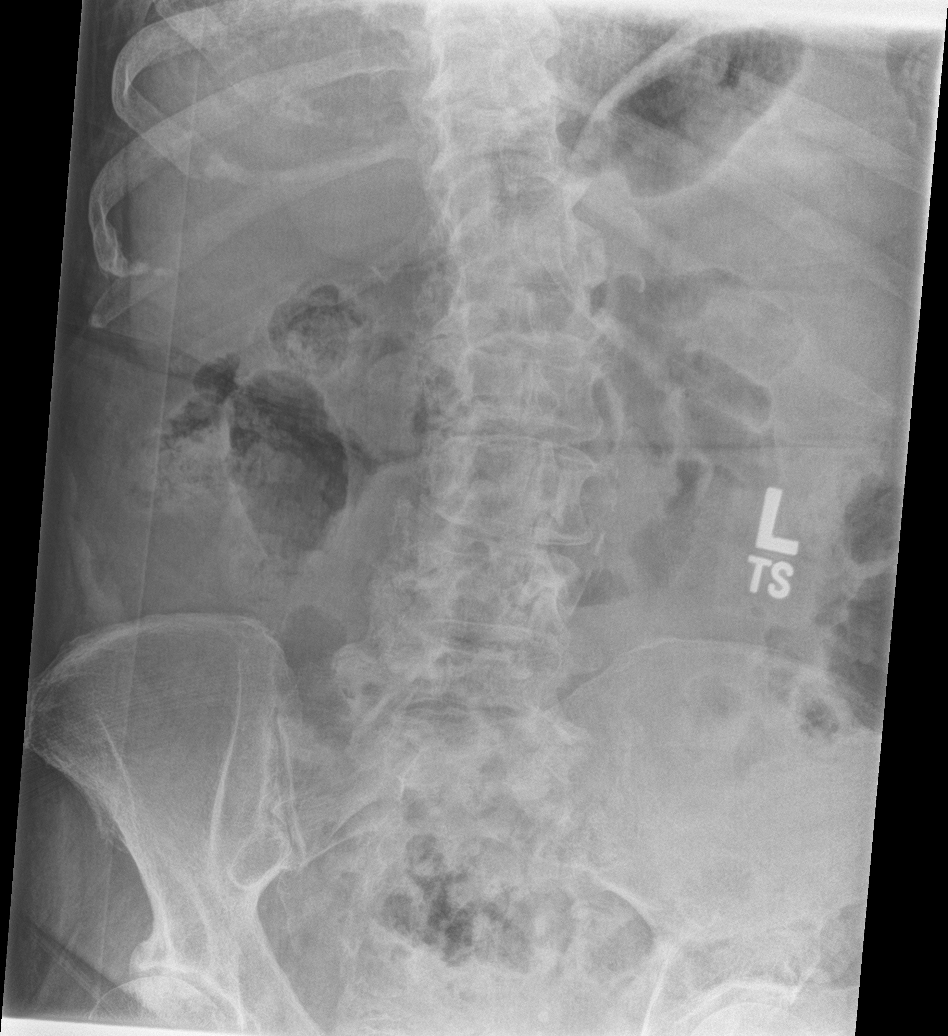
[im 4/5]
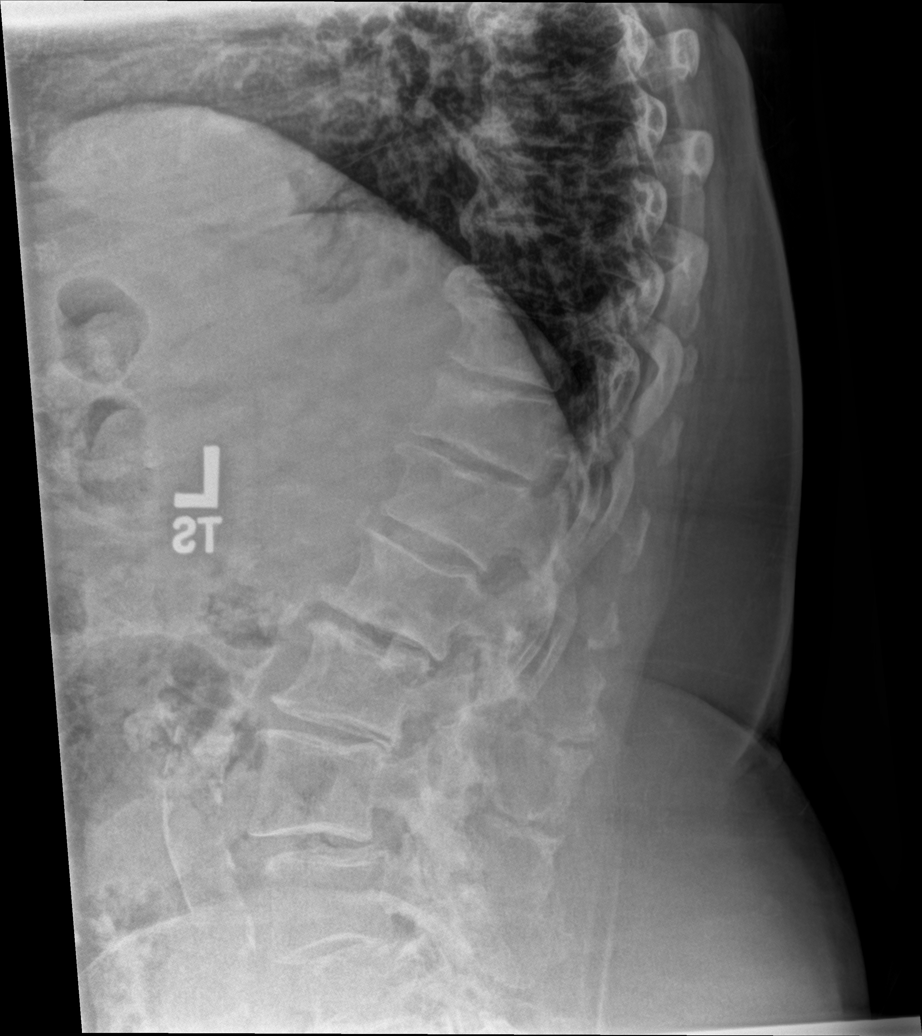
[im 5/5]
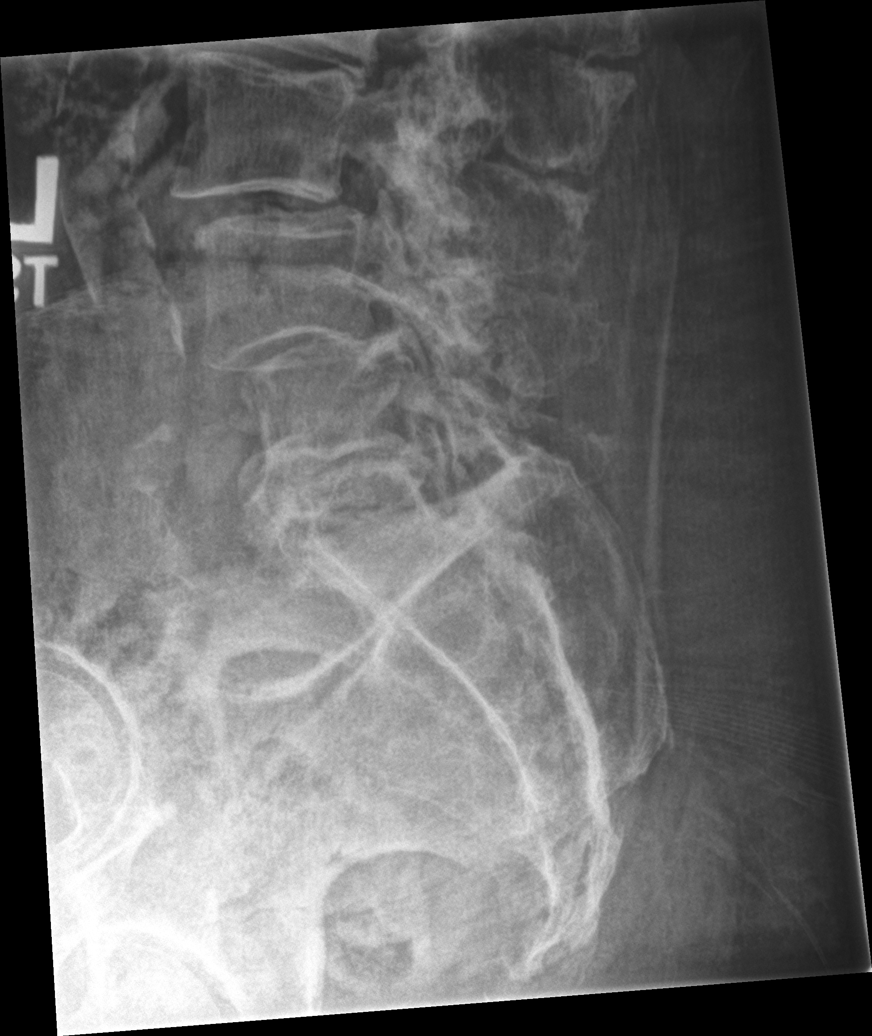

[5 of 5 positions shown; findings below may reference images not displayed]

FINDINGS: Grade 1 anterolisthesis L3-4 L4-5 has progressed. Slight
retrolisthesis L1-2 and L2-3 also has progressed.

Progressive multilevel disc degeneration with disc space narrowing
and spurring throughout the lumbar spine most notably L1-2 and L2-3.
Lumbar facet degeneration.

Negative for fracture or mass.

Atherosclerotic aorta
IMPRESSION: Progression of multilevel disc and facet degeneration throughout the
lumbar spine. Negative for fracture.

## 2019-11-13 NOTE — Progress Notes (Signed)
Subjective:   Sharon Cooper is a 86 y.o. female who presents for Medicare Annual (Subsequent) preventive examination.  I connected with Merton Border today by telephone and verified that I am speaking with the correct person using two identifiers. Location patient: home Location provider: work Persons participating in the virtual visit: patient, provider.   I discussed the limitations, risks, security and privacy concerns of performing an evaluation and management service by telephone and the availability of in person appointments. I also discussed with the patient that there may be a patient responsible charge related to this service. The patient expressed understanding and verbally consented to this telephonic visit.    Interactive audio and video telecommunications were attempted between this provider and patient, however failed, due to patient having technical difficulties OR patient did not have access to video capability.  We continued and completed visit with audio only.   Review of Systems    N/A  Cardiac Risk Factors include: advanced age (>23men, >46 women);obesity (BMI >30kg/m2);dyslipidemia;hypertension     Objective:    There were no vitals filed for this visit. There is no height or weight on file to calculate BMI.  Advanced Directives 11/17/2019 10/06/2012  Does Patient Have a Medical Advance Directive? Yes Patient has advance directive, copy not in chart  Type of Advance Directive [place];Living will -  Copy of [place] in Chart? No - copy requested Copy requested from family  Pre-existing out of facility DNR order (yellow form or pink MOST form) - No    Current Medications (verified) Outpatient Encounter Medications as of 11/17/2019  Medication Sig  . acetaminophen (TYLENOL) 325 MG tablet Take 2 tablets (650 mg total) by mouth every 6 (six) hours as needed.  Marland Kitchen amLODipine (NORVASC) 5 MG tablet Take 1 tablet (5 mg total) by  mouth daily.  Marland Kitchen aspirin EC 81 MG tablet Take 81 mg by mouth daily.  . Cholecalciferol (VITAMIN D-3) 1000 UNITS CAPS Take 1 capsule by mouth daily.  . cyanocobalamin 100 MCG tablet Take 1 tablet by mouth daily.  Marland Kitchen lisinopril (ZESTRIL) 40 MG tablet TAKE 1 TABLET BY MOUTH DAILY  . metoprolol succinate (TOPROL-XL) 25 MG 24 hr tablet Take 0.5 tablets (12.5 mg total) by mouth daily.  . Omega-3 Fatty Acids (FISH OIL BURP-LESS) 1000 MG CAPS Take 1 tablet by mouth daily.  . pravastatin (PRAVACHOL) 10 MG tablet Take 1 tablet (10 mg total) by mouth daily.  . Ascorbic Acid (VITAMIN C) 100 MG tablet Take 100 mg by mouth daily. (Patient not taking: Reported on 11/17/2019)   Facility-Administered Encounter Medications as of 11/17/2019  Medication  . methylPREDNISolone acetate (DEPO-MEDROL) injection 80 mg    Allergies (verified) Lactose intolerance (gi), Atorvastatin, Milk protein, and Pravastatin sodium   History: Past Medical History:  Diagnosis Date  . Cancer ([place])    Right kidney removed  . Hyperlipidemia   . Hypertension    Past Surgical History:  Procedure Laterality Date  . CARPAL TUNNEL RELEASE    . KNEE SURGERY Right   . NEPHRECTOMY Right    metastatic cancer  . NEPHRECTOMY     Family History  Problem Relation Age of Onset  . Diabetes Son    Social History   Socioeconomic History  . Marital status: Widowed    Spouse name: Not on file  . Number of children: 2  . Years of education: Not on file  . Highest education level: High school graduate  Occupational History  .  Occupation: retired  Tobacco Use  . Smoking status: Never Smoker  . Smokeless tobacco: Never Used  Vaping Use  . Vaping Use: Never used  Substance and Sexual Activity  . Alcohol use: No  . Drug use: No  . Sexual activity: Never    Birth control/protection: Abstinence  Other Topics Concern  . Not on file  Social History Narrative  . Not on file   Social Determinants of Health   Financial Resource  Strain: Low Risk   . Difficulty of Paying Living Expenses: Not hard at all  Food Insecurity: No Food Insecurity  . Worried About Charity fundraiser in the Last Year: Never true  . Ran Out of Food in the Last Year: Never true  Transportation Needs: No Transportation Needs  . Lack of Transportation (Medical): No  . Lack of Transportation (Non-Medical): No  Physical Activity: Inactive  . Days of Exercise per Week: 0 days  . Minutes of Exercise per Session: 0 min  Stress: No Stress Concern Present  . Feeling of Stress : Only a little  Social Connections: Socially Isolated  . Frequency of Communication with Friends and Family: More than three times a week  . Frequency of Social Gatherings with Friends and Family: Patient refused  . Attends Religious Services: Never  . Active Member of Clubs or Organizations: No  . Attends Archivist Meetings: Never  . Marital Status: Widowed    Tobacco Counseling Counseling given: Not Answered   Clinical Intake:  Pre-visit preparation completed: Yes  Pain : No/denies pain     Nutritional Risks: None Diabetes: No  How often do you need to have someone help you when you read instructions, pamphlets, or other written materials from your doctor or pharmacy?: 1 - Never  Diabetic? No  Interpreter Needed?: No  Information entered by :: [facility], LPN   Activities of Daily Living In your present state of health, do you have any difficulty performing the following activities: 11/17/2019 02/24/2019  Hearing? Tempie Donning  Comment Wears bilateral hearing aids. -  Vision? N N  Difficulty concentrating or making decisions? N N  Walking or climbing stairs? Y Y  Comment Due to weakness. -  Dressing or bathing? N N  Doing errands, shopping? N Y  Conservation officer, nature and eating ? N -  Using the Toilet? N -  In the past six months, have you accidently leaked urine? N -  Do you have problems with loss of bowel control? N -  Managing your Medications? Y  -  Comment Grandaughter assists with medications. -  Managing your Finances? N -  Housekeeping or managing your Housekeeping? N -  Some recent data might be hidden    Patient Care Team: Jerrol Banana., MD as PCP - General (Family Medicine) [place], [facility] (Optometry) [place], Laurice Record, MD (Orthopedic Surgery)  Indicate any recent Medical Services you may have received from other than [facility] providers in the past year (date may be approximate).     Assessment:   This is a routine wellness examination for Sharon Cooper.  Hearing/Vision screen No exam data present  Dietary issues and exercise activities discussed: Current Exercise Habits: The patient does not participate in regular exercise at present, Exercise limited by: orthopedic condition(s)  Goals    . DIET - INCREASE WATER INTAKE     Recommend to drink at least 6-8 8oz glasses of water per day.      Depression Screen PHQ 2/9 Scores 02/24/2019  01/01/2017 12/16/2015  PHQ - 2 Score 0 0 0  PHQ- 9 Score 0 0 -    Fall Risk Fall Risk  11/17/2019 01/01/2017 12/16/2015  Falls in the past year? 0 No No  Number falls in past yr: 0 - -  Injury with Fall? 0 - -    Any stairs in or around the home? Yes  If so, are there any without handrails? No  Home free of loose throw rugs in walkways, pet beds, electrical cords, etc? Yes  Adequate lighting in your home to reduce risk of falls? Yes   ASSISTIVE DEVICES UTILIZED TO PREVENT FALLS:  Life alert? No  Use of a cane, walker or w/c? Yes  Grab bars in the bathroom? Yes  Shower chair or bench in shower? Yes  Elevated toilet seat or a handicapped toilet? Yes     Cognitive Function: Declined today.         Immunizations Immunization History  Administered Date(s) Administered  . Tdap 02/04/2014  . Zoster 06/28/2007    TDAP status: Up to date Flu Vaccine status: Declined, Education has been provided regarding the importance of this vaccine but patient still declined.  Advised may receive this vaccine at local pharmacy or Health Dept. Aware to provide a copy of the vaccination record if obtained from local pharmacy or Health Dept. Verbalized acceptance and understanding. Pneumococcal vaccine status: Declined,  Education has been provided regarding the importance of this vaccine but patient still declined. Advised may receive this vaccine at local pharmacy or Health Dept. Aware to provide a copy of the vaccination record if obtained from local pharmacy or Health Dept. Verbalized acceptance and understanding.  Covid-19 vaccine status: Completed vaccines  Qualifies for Shingles Vaccine? Yes   Zostavax completed Yes   Shingrix Completed?: No.    Education has been provided regarding the importance of this vaccine. Patient has been advised to call insurance company to determine out of pocket expense if they have not yet received this vaccine. Advised may also receive vaccine at local pharmacy or Health Dept. Verbalized acceptance and understanding.  Screening Tests Health Maintenance  Topic Date Due  . COVID-19 Vaccine (1) Never done  . INFLUENZA VACCINE  06/24/2020 (Originally 10/26/2019)  . DEXA SCAN  11/16/2020 (Originally 11/11/1997)  . PNA vac Low Risk Adult (1 of 2 - PCV13) 11/16/2020 (Originally 11/11/1997)  . TETANUS/TDAP  02/05/2024    Health Maintenance  Health Maintenance Due  Topic Date Due  . COVID-19 Vaccine (1) Never done    Colorectal cancer screening: No longer required.  Mammogram status: No longer required.  Bone Density status: Currently due. Declined order.   Lung Cancer Screening: (Low Dose CT Chest recommended if Age 28-80 years, 30 pack-year currently smoking OR have quit w/in 15years.) does not qualify.    Additional Screening:  Vision Screening: Recommended annual ophthalmology exams for early detection of glaucoma and other disorders of the eye. Is the patient up to date with their annual eye exam?  Yes  Who is the provider or  what is the name of the office in which the patient attends annual eye exams? [facility] If pt is not established with a provider, would they like to be referred to a provider to establish care? No .   Dental Screening: Recommended annual dental exams for proper oral hygiene  Community Resource Referral / Chronic Care Management: CRR required this visit?  No   CCM required this visit?  No  Plan:     I have personally reviewed and noted the following in the patient's chart:   . Medical and social history . Use of alcohol, tobacco or illicit drugs  . Current medications and supplements . Functional ability and status . Nutritional status . Physical activity . Advanced directives . List of other physicians . Hospitalizations, surgeries, and ER visits in previous 12 months . Vitals . Screenings to include cognitive, depression, and falls . Referrals and appointments  In addition, I have reviewed and discussed with patient certain preventive protocols, quality metrics, and best practice recommendations. A written personalized care plan for preventive services as well as general preventive health recommendations were provided to patient.     Sharon Cooper [place], [place]   [id number]/[id number]   Nurse Notes: Declined a future DEXA order, pneumonia shot or flu shot.

## 2019-11-17 ENCOUNTER — Other Ambulatory Visit: Payer: Self-pay

## 2019-11-17 ENCOUNTER — Ambulatory Visit (INDEPENDENT_AMBULATORY_CARE_PROVIDER_SITE_OTHER): Payer: Medicare Other

## 2019-11-17 DIAGNOSIS — E7800 Pure hypercholesterolemia, unspecified: Secondary | ICD-10-CM

## 2019-11-17 DIAGNOSIS — Z0000 Encounter for general adult medical examination without abnormal findings: Secondary | ICD-10-CM | POA: Diagnosis not present

## 2019-11-17 DIAGNOSIS — E1169 Type 2 diabetes mellitus with other specified complication: Secondary | ICD-10-CM

## 2019-11-17 DIAGNOSIS — I10 Essential (primary) hypertension: Secondary | ICD-10-CM

## 2019-11-17 NOTE — Patient Instructions (Signed)
Sharon Cooper , Thank you for taking time to come for your Medicare Wellness Visit. I appreciate your ongoing commitment to your health goals. Please review the following plan we discussed and let me know if I can assist you in the future.   Screening recommendations/referrals: Colonoscopy: No longer required.  Mammogram: No longer required.  Bone Density: Currently due. Declined a future order.  Recommended yearly ophthalmology/optometry visit for glaucoma screening and checkup Recommended yearly dental visit for hygiene and checkup  Vaccinations: Influenza vaccine: Due fall 2021 Pneumococcal vaccine: Pneumovax 23 due Tdap vaccine: Up to date, due 01/2024 Shingles vaccine: Shingrix discussed. Please contact your pharmacy for coverage information.     Advanced directives: Please bring a copy of your POA (Power of Attorney) and/or Living Will to your next appointment.   Conditions/risks identified: Recommend to drink at least 6-8 8oz glasses of water per day.  Next appointment: None- declined scheduling a follow up with PCP or an AWV for 2022 at this time.    Preventive Care 84 Years and Older, Female Preventive care refers to lifestyle choices and visits with your health care provider that can promote health and wellness. What does preventive care include?  A yearly physical exam. This is also called an annual well check.  Dental exams once or twice a year.  Routine eye exams. Ask your health care provider how often you should have your eyes checked.  Personal lifestyle choices, including:  Daily care of your teeth and gums.  Regular physical activity.  Eating a healthy diet.  Avoiding tobacco and drug use.  Limiting alcohol use.  Practicing safe sex.  Taking low-dose aspirin every day.  Taking vitamin and mineral supplements as recommended by your health care provider. What happens during an annual well check? The services and screenings done by your health care  provider during your annual well check will depend on your age, overall health, lifestyle risk factors, and family history of disease. Counseling  Your health care provider may ask you questions about your:  Alcohol use.  Tobacco use.  Drug use.  Emotional well-being.  Home and relationship well-being.  Sexual activity.  Eating habits.  History of falls.  Memory and ability to understand (cognition).  Work and work Statistician.  Reproductive health. Screening  You may have the following tests or measurements:  Height, weight, and BMI.  Blood pressure.  Lipid and cholesterol levels. These may be checked every 5 years, or more frequently if you are over 21 years old.  Skin check.  Lung cancer screening. You may have this screening every year starting at age 31 if you have a 30-pack-year history of smoking and currently smoke or have quit within the past 15 years.  Fecal occult blood test (FOBT) of the stool. You may have this test every year starting at age 38.  Flexible sigmoidoscopy or colonoscopy. You may have a sigmoidoscopy every 5 years or a colonoscopy every 10 years starting at age 25.  Hepatitis C blood test.  Hepatitis B blood test.  Sexually transmitted disease (STD) testing.  Diabetes screening. This is done by checking your blood sugar (glucose) after you have not eaten for a while (fasting). You may have this done every 1-3 years.  Bone density scan. This is done to screen for osteoporosis. You may have this done starting at age 29.  Mammogram. This may be done every 1-2 years. Talk to your health care provider about how often you should have regular mammograms. Talk with  your health care provider about your test results, treatment options, and if necessary, the need for more tests. Vaccines  Your health care provider may recommend certain vaccines, such as:  Influenza vaccine. This is recommended every year.  Tetanus, diphtheria, and acellular  pertussis (Tdap, Td) vaccine. You may need a Td booster every 10 years.  Zoster vaccine. You may need this after age 54.  Pneumococcal 13-valent conjugate (PCV13) vaccine. One dose is recommended after age 7.  Pneumococcal polysaccharide (PPSV23) vaccine. One dose is recommended after age 34. Talk to your health care provider about which screenings and vaccines you need and how often you need them. This information is not intended to replace advice given to you by your health care provider. Make sure you discuss any questions you have with your health care provider. Document Released: 04/09/2015 Document Revised: 12/01/2015 Document Reviewed: 01/12/2015 Elsevier Interactive Patient Education  2017 [employer] Prevention in the Home Falls can cause injuries. They can happen to people of all ages. There are many things you can do to make your home safe and to help prevent falls. What can I do on the outside of my home?  Regularly fix the edges of walkways and driveways and fix any cracks.  Remove anything that might make you trip as you walk through a door, such as a raised step or threshold.  Trim any bushes or trees on the path to your home.  Use bright outdoor lighting.  Clear any walking paths of anything that might make someone trip, such as rocks or tools.  Regularly check to see if handrails are loose or broken. Make sure that both sides of any steps have handrails.  Any raised decks and porches should have guardrails on the edges.  Have any leaves, snow, or ice cleared regularly.  Use sand or salt on walking paths during winter.  Clean up any spills in your garage right away. This includes oil or grease spills. What can I do in the bathroom?  Use night lights.  Install grab bars by the toilet and in the tub and shower. Do not use towel bars as grab bars.  Use non-skid mats or decals in the tub or shower.  If you need to sit down in the shower, use a plastic,  non-slip stool.  Keep the floor dry. Clean up any water that spills on the floor as soon as it happens.  Remove soap buildup in the tub or shower regularly.  Attach bath mats securely with double-sided non-slip rug tape.  Do not have throw rugs and other things on the floor that can make you trip. What can I do in the bedroom?  Use night lights.  Make sure that you have a light by your bed that is easy to reach.  Do not use any sheets or blankets that are too big for your bed. They should not hang down onto the floor.  Have a firm chair that has side arms. You can use this for support while you get dressed.  Do not have throw rugs and other things on the floor that can make you trip. What can I do in the kitchen?  Clean up any spills right away.  Avoid walking on wet floors.  Keep items that you use a lot in easy-to-reach places.  If you need to reach something above you, use a strong step stool that has a grab bar.  Keep electrical cords out of the way.  Do not  use floor polish or wax that makes floors slippery. If you must use wax, use non-skid floor wax.  Do not have throw rugs and other things on the floor that can make you trip. What can I do with my stairs?  Do not leave any items on the stairs.  Make sure that there are handrails on both sides of the stairs and use them. Fix handrails that are broken or loose. Make sure that handrails are as long as the stairways.  Check any carpeting to make sure that it is firmly attached to the stairs. Fix any carpet that is loose or worn.  Avoid having throw rugs at the top or bottom of the stairs. If you do have throw rugs, attach them to the floor with carpet tape.  Make sure that you have a light switch at the top of the stairs and the bottom of the stairs. If you do not have them, ask someone to add them for you. What else can I do to help prevent falls?  Wear shoes that:  Do not have high heels.  Have rubber  bottoms.  Are comfortable and fit you well.  Are closed at the toe. Do not wear sandals.  If you use a stepladder:  Make sure that it is fully opened. Do not climb a closed stepladder.  Make sure that both sides of the stepladder are locked into place.  Ask someone to hold it for you, if possible.  Clearly mark and make sure that you can see:  Any grab bars or handrails.  First and last steps.  Where the edge of each step is.  Use tools that help you move around (mobility aids) if they are needed. These include:  Canes.  Walkers.  Scooters.  Crutches.  Turn on the lights when you go into a dark area. Replace any light bulbs as soon as they burn out.  Set up your furniture so you have a clear path. Avoid moving your furniture around.  If any of your floors are uneven, fix them.  If there are any pets around you, be aware of where they are.  Review your medicines with your doctor. Some medicines can make you feel dizzy. This can increase your chance of falling. Ask your doctor what other things that you can do to help prevent falls. This information is not intended to replace advice given to you by your health care provider. Make sure you discuss any questions you have with your health care provider. Document Released: 01/07/2009 Document Revised: 08/19/2015 Document Reviewed: 04/17/2014 Elsevier Interactive Patient Education  2017 [employer].

## 2019-11-17 NOTE — Telephone Encounter (Signed)
Spoke with patient today and completed her telephonic AWV. Pt is requesting a 90 days refill on Lisinopril 40 mg, Pravastatin 10 mg and Metoprolol 25 mg (1/2 tablet daily). Pt declined scheduling a f/u with PCP at this time due to Covid. Pt would like prescriptions sent to [facility]. Please advise, thank you.

## 2019-11-19 ENCOUNTER — Other Ambulatory Visit: Payer: Self-pay | Admitting: Family Medicine

## 2019-11-19 ENCOUNTER — Telehealth: Payer: Self-pay

## 2019-11-19 DIAGNOSIS — I10 Essential (primary) hypertension: Secondary | ICD-10-CM

## 2019-11-19 NOTE — Telephone Encounter (Signed)
Patient needs to be seen in the office. Patient will not get any 90 day refills until she is seen, pcp says that he usually sees her twice a year.

## 2019-11-19 NOTE — Telephone Encounter (Signed)
Copied from [place] [phone]. Topic: General - Other >> Nov 19, 2019  1:44 PM Yvette Rack wrote: Reason for CRM: Altha Harm with [facility] stated she just had a long conversation with the patient who insists that all her Rx medications be approved for a 90 day supply. Altha Harm called to make the office aware of the patient's request

## 2019-11-19 NOTE — Telephone Encounter (Signed)
Patient needs an office visit. Patient has not been seen since 2020.

## 2019-12-10 ENCOUNTER — Other Ambulatory Visit: Payer: Self-pay | Admitting: Family Medicine

## 2019-12-10 DIAGNOSIS — I10 Essential (primary) hypertension: Secondary | ICD-10-CM

## 2019-12-10 NOTE — Telephone Encounter (Signed)
Requested medication (s) are due for refill today: Yes  Requested medication (s) are on the active medication list: Yes  Last refill:  11/19/19  Future visit scheduled: No  Notes to clinic:  Pt. States she does not want to come into the office because of COVID 19. Declines virtual visit. Requests 3 month supply of medication. Please advise.    Requested Prescriptions  Refused Prescriptions Disp Refills   lisinopril (ZESTRIL) 40 MG tablet [Pharmacy Med Name: LISINOPRIL 40 MG TAB] 30 tablet 0    Sig: TAKE 1 TABLET BY MOUTH DAILY      Cardiovascular:  ACE Inhibitors Failed - 12/10/2019 11:08 AM      Failed - Cr in normal range and within 180 days    Creatinine  Date Value Ref Range Status  08/30/2011 0.95 0.60 - 1.30 mg/dL Final   Creatinine, Ser  Date Value Ref Range Status  01/30/2018 1.16 (H) 0.57 - 1.00 mg/dL Final          Failed - K in normal range and within 180 days    Potassium  Date Value Ref Range Status  01/30/2018 4.0 3.5 - 5.2 mmol/L Final  08/30/2011 3.8 3.5 - 5.1 mmol/L Final          Failed - Last BP in normal range    BP Readings from Last 1 Encounters:  01/30/18 (!) 142/80          Failed - Valid encounter within last 6 months    Recent Outpatient Visits           9 months ago Essential hypertension   [facility] Jerrol Banana., MD   1 year ago Essential hypertension   [facility] Jerrol Banana., MD   2 years ago Essential hypertension   [facility] Jerrol Banana., MD   2 years ago Pain of right hip joint   [facility] Jerrol Banana., MD   2 years ago Essential hypertension   [facility] Jerrol Banana., MD              Passed - Patient is not pregnant

## 2019-12-10 NOTE — Telephone Encounter (Signed)
Patient would like a 90 day supply of lisinopril (ZESTRIL) 40 MG tablet, patient upset only 30 day supply was sent in in August. Patient would like a follow up call when completed   [name], [place] Phone:  [phone]  Fax:  [phone]

## 2020-01-09 ENCOUNTER — Other Ambulatory Visit: Payer: Self-pay | Admitting: Family Medicine

## 2020-01-09 DIAGNOSIS — I10 Essential (primary) hypertension: Secondary | ICD-10-CM

## 2020-01-09 NOTE — Telephone Encounter (Signed)
Requested medication (s) are due for refill today: yes  Requested medication (s) are on the active medication list: yes  Last refill:  11/19/19  #30  0 refill  Future visit scheduled: No  Notes to clinic:  Called and spoke with patient to schedule appointment. She became very upset.  She states she can't get to the office Its very difficult for her to walk She needs someone with her.  She states she had the wellness visit that was suppose to take the place of visit. She is afraid of the office and COVID-19. She has just lost a good friend.  Sorry I tried to explain that labs are needed to make sure it is safe for her to take certain medication.  She still refused. She states she has already told us her feelings.    Requested Prescriptions  Pending Prescriptions Disp Refills   lisinopril (ZESTRIL) 40 MG tablet [Pharmacy Med Name: LISINOPRIL 40 MG TAB] 30 tablet 0    Sig: TAKE 1 TABLET BY MOUTH DAILY      Cardiovascular:  ACE Inhibitors Failed - 01/09/2020 12:58 PM      Failed - Cr in normal range and within 180 days    Creatinine  Date Value Ref Range Status  08/30/2011 0.95 0.60 - 1.30 mg/dL Final   Creatinine, Ser  Date Value Ref Range Status  01/30/2018 1.16 (H) 0.57 - 1.00 mg/dL Final          Failed - K in normal range and within 180 days    Potassium  Date Value Ref Range Status  01/30/2018 4.0 3.5 - 5.2 mmol/L Final  08/30/2011 3.8 3.5 - 5.1 mmol/L Final          Failed - Last BP in normal range    BP Readings from Last 1 Encounters:  01/30/18 (!) 142/80          Failed - Valid encounter within last 6 months    Recent Outpatient Visits           10 months ago Essential hypertension   [facility] Jerrol Banana., MD   1 year ago Essential hypertension   [facility] Jerrol Banana., MD   2 years ago Essential hypertension   [facility] Jerrol Banana., MD   2 years ago Pain of right hip joint    [facility] Jerrol Banana., MD   3 years ago Essential hypertension   [facility] Jerrol Banana., MD              Passed - Patient is not pregnant

## 2020-02-13 ENCOUNTER — Other Ambulatory Visit: Payer: Self-pay | Admitting: Family Medicine

## 2020-02-13 DIAGNOSIS — I10 Essential (primary) hypertension: Secondary | ICD-10-CM

## 2020-02-13 NOTE — Telephone Encounter (Signed)
Requested medication (s) are due for refill today: yes  Requested medication (s) are on the active medication list: yes  Last refill:  01/14/2020  Future visit scheduled:no  Notes to clinic:  Patient had a Medicare physical but not a office visit  Review for refill   Requested Prescriptions  Pending Prescriptions Disp Refills   lisinopril (ZESTRIL) 40 MG tablet [Pharmacy Med Name: LISINOPRIL 40 MG TAB] 30 tablet 0    Sig: TAKE 1 TABLET BY MOUTH DAILY      Cardiovascular:  ACE Inhibitors Failed - 02/13/2020 12:57 PM      Failed - Cr in normal range and within 180 days    Creatinine  Date Value Ref Range Status  08/30/2011 0.95 0.60 - 1.30 mg/dL Final   Creatinine, Ser  Date Value Ref Range Status  01/30/2018 1.16 (H) 0.57 - 1.00 mg/dL Final          Failed - K in normal range and within 180 days    Potassium  Date Value Ref Range Status  01/30/2018 4.0 3.5 - 5.2 mmol/L Final  08/30/2011 3.8 3.5 - 5.1 mmol/L Final          Failed - Last BP in normal range    BP Readings from Last 1 Encounters:  01/30/18 (!) 142/80          Failed - Valid encounter within last 6 months    Recent Outpatient Visits           11 months ago Essential hypertension   [facility] Jerrol Banana., MD   2 years ago Essential hypertension   [facility] Jerrol Banana., MD   2 years ago Essential hypertension   [facility] Jerrol Banana., MD   2 years ago Pain of right hip joint   [facility] Jerrol Banana., MD   3 years ago Essential hypertension   [facility] Jerrol Banana., MD              Passed - Patient is not pregnant

## 2020-03-22 ENCOUNTER — Other Ambulatory Visit: Payer: Self-pay | Admitting: Family Medicine

## 2020-03-22 DIAGNOSIS — I10 Essential (primary) hypertension: Secondary | ICD-10-CM

## 2020-03-22 NOTE — Telephone Encounter (Signed)
Requested medication (s) are due for refill today: Yes  Requested medication (s) are on the active medication list: Yes  Last refill:  02/16/20  Future visit scheduled: No  Notes to clinic:  Unable to refill per protocol, appointment needed, see TE notes attached from patient     Requested Prescriptions  Pending Prescriptions Disp Refills   lisinopril (ZESTRIL) 40 MG tablet 30 tablet 0    Sig: Take 1 tablet (40 mg total) by mouth daily.      Cardiovascular:  ACE Inhibitors Failed - 03/22/2020  1:55 PM      Failed - Cr in normal range and within 180 days    Creatinine  Date Value Ref Range Status  08/30/2011 0.95 0.60 - 1.30 mg/dL Final   Creatinine, Ser  Date Value Ref Range Status  01/30/2018 1.16 (H) 0.57 - 1.00 mg/dL Final          Failed - K in normal range and within 180 days    Potassium  Date Value Ref Range Status  01/30/2018 4.0 3.5 - 5.2 mmol/L Final  08/30/2011 3.8 3.5 - 5.1 mmol/L Final          Failed - Last BP in normal range    BP Readings from Last 1 Encounters:  01/30/18 (!) 142/80          Failed - Valid encounter within last 6 months    Recent Outpatient Visits           1 year ago Essential hypertension   [facility] Jerrol Banana., MD   2 years ago Essential hypertension   [facility] Jerrol Banana., MD   2 years ago Essential hypertension   [facility] Jerrol Banana., MD   2 years ago Pain of right hip joint   [facility] Jerrol Banana., MD   3 years ago Essential hypertension   [facility] Jerrol Banana., MD                Passed - Patient is not pregnant

## 2020-03-22 NOTE — Telephone Encounter (Signed)
Copied from CRM [id number]. Topic: General - Other >> Mar 22, 2020  1:28 PM Jaquita Rector A wrote: Reason for CRM: Patient called in to inquire of Dr Sullivan Lone why she cant get her medication for the 90 days she is very upset that she cant get her medication. Per patient she cant walk and stated that she got 2 letters from [facility] stating that she have done all she needed for the year and need to be able to get her medication for the 90 days which is more convenient. Patient was informed that her last in office visit was on 01/30/2018 and her last phone visit was on 02/24/2019. Patient is asking Dr Sullivan Lone or his nurse to give her a call back ASAP to discuss the issues that she is having. Per patient she does not want to get Covid and cant understand why Dr Sullivan Lone want her to die by not giving her the medication. Please call  Ph# [phone]

## 2020-03-22 NOTE — Telephone Encounter (Signed)
Copied from CRM [id number]. Topic: Quick Communication - Rx Refill/Question >> Mar 22, 2020  1:38 PM Sharon, Swint Cooper wrote: Medication: lisinopril (ZESTRIL) 40 MG tablet Completely out took last one on 03/21/20   Has the patient contacted their pharmacy? Yes.   (Agent: If no, request that the patient contact the pharmacy for the refill.) (Agent: If yes, when and what did the pharmacy advise?)  Preferred Pharmacy (with phone number or street name): [facility] - [place], [place] - [address]  Phone:  [phone] Fax:  [phone]     Agent: Please be advised that RX refills may take up to 3 business days. We ask that you follow-up with your pharmacy.

## 2020-03-22 NOTE — Telephone Encounter (Signed)
Please review. LOV 01/2019. Dr. Sullivan Lone pt.

## 2020-03-22 NOTE — Telephone Encounter (Signed)
She needs to schedule follow up Dr. Sullivan Lone first.

## 2020-03-23 MED ORDER — LISINOPRIL 40 MG PO TABS: 40.0000 mg | ORAL_TABLET | Freq: Every day | ORAL | 1 refills | Status: DC

## 2020-03-23 NOTE — Telephone Encounter (Signed)
Pt calling again. She is very upset that she is needing to set up an appt. She states that this is a matter of life and death and is requesting to speak with someone else urgently. Please advise.

## 2020-03-23 NOTE — Telephone Encounter (Signed)
I called and spoke with patient and advised her that she needs to schedule a follow up appointment with Dr. Sullivan Lone. I explained to patient the importance of having close follow ups and labs for patients taking blood pressure medication. Patient verbalized understanding.  Patient agreed to come in and scheduled an appointment for 04/09/2019 at 1:40pm. She is completely out of medication and wants to know if someone would send in a refill to get by until she sees Dr. Sullivan Lone. Patient is frustrated because she states no one from our office has actually called her to explain things to her.

## 2020-03-23 NOTE — Addendum Note (Signed)
Addended by: Benjiman Core on: 03/23/2020 04:03 PM   Modules accepted: Orders

## 2020-04-08 ENCOUNTER — Other Ambulatory Visit: Payer: Self-pay

## 2020-04-08 ENCOUNTER — Ambulatory Visit: Payer: Medicare Other | Admitting: Family Medicine

## 2020-04-08 ENCOUNTER — Encounter: Payer: Self-pay | Admitting: Family Medicine

## 2020-04-08 VITALS — BP 161/73 | HR 72 | Temp 98.2°F | Wt 248.0 lb

## 2020-04-08 DIAGNOSIS — M1710 Unilateral primary osteoarthritis, unspecified knee: Secondary | ICD-10-CM

## 2020-04-08 DIAGNOSIS — I10 Essential (primary) hypertension: Secondary | ICD-10-CM

## 2020-04-08 DIAGNOSIS — E785 Hyperlipidemia, unspecified: Secondary | ICD-10-CM

## 2020-04-08 DIAGNOSIS — C649 Malignant neoplasm of unspecified kidney, except renal pelvis: Secondary | ICD-10-CM

## 2020-04-08 DIAGNOSIS — Z23 Encounter for immunization: Secondary | ICD-10-CM | POA: Diagnosis not present

## 2020-04-08 DIAGNOSIS — E559 Vitamin D deficiency, unspecified: Secondary | ICD-10-CM

## 2020-04-08 DIAGNOSIS — Z6841 Body Mass Index (BMI) 40.0 and over, adult: Secondary | ICD-10-CM
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
## 2020-04-09 LAB — COMPREHENSIVE METABOLIC PANEL
ALT: 8 IU/L (ref 0–32)
AST: 11 IU/L (ref 0–40)
Albumin/Globulin Ratio: 1.7 (ref 1.2–2.2)
Albumin: 4.2 g/dL (ref 3.6–4.6)
Alkaline Phosphatase: 92 IU/L (ref 44–121)
BUN/Creatinine Ratio: 18 (ref 12–28)
BUN: 22 mg/dL (ref 8–27)
Bilirubin Total: 0.3 mg/dL (ref 0.0–1.2)
CO2: 18 mmol/L — ABNORMAL LOW (ref 20–29)
Calcium: 10.1 mg/dL (ref 8.7–10.3)
Chloride: 105 mmol/L (ref 96–106)
Creatinine, Ser: 1.21 mg/dL — ABNORMAL HIGH (ref 0.57–1.00)
GFR calc Af Amer: 46 mL/min/{1.73_m2} — ABNORMAL LOW (ref >59)
GFR calc non Af Amer: 40 mL/min/{1.73_m2} — ABNORMAL LOW (ref >59)
Globulin, Total: 2.5 g/dL (ref 1.5–4.5)
Glucose: 88 mg/dL (ref 65–99)
Potassium: 4.2 mmol/L (ref 3.5–5.2)
Sodium: 140 mmol/L (ref 134–144)
Total Protein: 6.7 g/dL (ref 6.0–8.5)

## 2020-04-09 LAB — LIPID PANEL
Chol/HDL Ratio: 4.3 ratio (ref 0.0–4.4)
Cholesterol, Total: 252 mg/dL — ABNORMAL HIGH (ref 100–199)
HDL: 59 mg/dL (ref >39)
LDL Chol Calc (NIH): 164 mg/dL — ABNORMAL HIGH (ref 0–99)
Triglycerides: 163 mg/dL — ABNORMAL HIGH (ref 0–149)
VLDL Cholesterol Cal: 29 mg/dL (ref 5–40)

## 2020-04-09 LAB — CBC WITH DIFFERENTIAL/PLATELET
Basophils Absolute: 0 10*3/uL (ref 0.0–0.2)
Basos: 1 %
EOS (ABSOLUTE): 0.1 10*3/uL (ref 0.0–0.4)
Eos: 2 %
Hematocrit: 41.3 % (ref 34.0–46.6)
Hemoglobin: 14 g/dL (ref 11.1–15.9)
Immature Grans (Abs): 0 10*3/uL (ref 0.0–0.1)
Immature Granulocytes: 0 %
Lymphocytes Absolute: 1.1 10*3/uL (ref 0.7–3.1)
Lymphs: 15 %
MCH: 30.4 pg (ref 26.6–33.0)
MCHC: 33.9 g/dL (ref 31.5–35.7)
MCV: 90 fL (ref 79–97)
Monocytes Absolute: 0.6 10*3/uL (ref 0.1–0.9)
Monocytes: 8 %
Neutrophils Absolute: 5.6 10*3/uL (ref 1.4–7.0)
Neutrophils: 74 %
Platelets: 199 10*3/uL (ref 150–450)
RBC: 4.61 x10E6/uL (ref 3.77–5.28)
RDW: 12.6 % (ref 11.7–15.4)
WBC: 7.4 10*3/uL (ref 3.4–10.8)

## 2020-04-09 LAB — TSH: TSH: 1.54 u[IU]/mL (ref 0.450–4.500)

## 2020-04-21 ENCOUNTER — Telehealth: Payer: Self-pay | Admitting: Family Medicine

## 2020-04-21 ENCOUNTER — Other Ambulatory Visit: Payer: Self-pay | Admitting: Family Medicine

## 2020-04-21 DIAGNOSIS — I10 Essential (primary) hypertension: Secondary | ICD-10-CM

## 2020-04-21 NOTE — Telephone Encounter (Signed)
Medication:  lisinopril (ZESTRIL) 40 MG tablet  Has the pt contacted their pharmacy? YES Pt is requesting a 90 day supply. Does not want to pick up the 30 day they have on file.  Preferred pharmacy: [place], [place] - [address]    Please be advised refills may take up to 3 business days.  We ask that you follow up with your pharmacy.

## 2020-04-21 NOTE — Telephone Encounter (Signed)
Copied from [place] [id number]. Topic: General - Other >> Apr 21, 2020  9:33 AM Sharon Cooper wrote: Reason for CRM: Pt called in very upset that her blood pressure medication has not been sent to her pharmacy. Pt stated she was seen almost 2 weeks ago so she is not sure why her prescriptions were not sent in. Pt stated she needs to pick up the medications today because she does not have any for tomorrow.

## 2020-04-21 NOTE — Telephone Encounter (Deleted)
Medication: lisinopril (ZESTRIL) 40 MG tablet  Has the pt contacted their pharmacy? YES  Preferred pharmacy:  [place], [place]  Pt upset this was not sent in.  Pt states she was just in the office, and thought this was updated.  Please send in asap, as pt wants to pick up before it gets dark tonight.

## 2020-04-21 NOTE — Telephone Encounter (Signed)
Last seen 1 week ago and future visit in 6 months

## 2020-06-01 ENCOUNTER — Other Ambulatory Visit: Payer: Self-pay | Admitting: Family Medicine

## 2020-06-01 DIAGNOSIS — E1169 Type 2 diabetes mellitus with other specified complication: Secondary | ICD-10-CM

## 2020-06-01 DIAGNOSIS — E785 Hyperlipidemia, unspecified: Secondary | ICD-10-CM

## 2020-06-01 DIAGNOSIS — E7800 Pure hypercholesterolemia, unspecified: Secondary | ICD-10-CM

## 2020-06-01 NOTE — Telephone Encounter (Signed)
Requested medication (s) are due for refill today:   Yes  Requested medication (s) are on the active medication list:   Yes  Future visit scheduled:   Yes   Last ordered: 04/17/2019 #90, 3 refills  Clinic note:  Returned because needs new Rx for refills.     Requested Prescriptions  Pending Prescriptions Disp Refills   pravastatin (PRAVACHOL) 10 MG tablet [Pharmacy Med Name: PRAVASTATIN SODIUM 10 MG TAB] 90 tablet 3    Sig: TAKE ONE TABLET EVERY DAY      Cardiovascular:  Antilipid - Statins Failed - 06/01/2020 12:59 PM      Failed - Total Cholesterol in normal range and within 360 days    Cholesterol, Total  Date Value Ref Range Status  04/08/2020 252 (H) 100 - 199 mg/dL Final          Failed - LDL in normal range and within 360 days    LDL Chol Calc (NIH)  Date Value Ref Range Status  04/08/2020 164 (H) 0 - 99 mg/dL Final          Failed - Triglycerides in normal range and within 360 days    Triglycerides  Date Value Ref Range Status  04/08/2020 163 (H) 0 - 149 mg/dL Final          Passed - HDL in normal range and within 360 days    HDL  Date Value Ref Range Status  04/08/2020 59 >39 mg/dL Final          Passed - Patient is not pregnant      Passed - Valid encounter within last 12 months    Recent Outpatient Visits           1 month ago Primary hypertension   [place] Jerrol Banana., MD   1 year ago Essential hypertension   [facility] Jerrol Banana., MD   2 years ago Essential hypertension   [facility] Jerrol Banana., MD   2 years ago Essential hypertension   [facility] Jerrol Banana., MD   2 years ago Pain of right hip joint   [facility] Jerrol Banana., MD       Future Appointments             In 4 months Jerrol Banana., MD [facility] [place]

## 2020-07-16 ENCOUNTER — Other Ambulatory Visit: Payer: Self-pay | Admitting: Family Medicine

## 2020-07-16 DIAGNOSIS — I10 Essential (primary) hypertension: Secondary | ICD-10-CM

## 2020-07-16 NOTE — Telephone Encounter (Signed)
Requested medication (s) are due for refill today: yes  Requested medication (s) are on the active medication list: yes  Last refill:  04/21/20  Future visit scheduled: yes  Notes to clinic:  insurance requests alternate medication    Requested Prescriptions  Pending Prescriptions Disp Refills   lisinopril (ZESTRIL) 40 MG tablet [Pharmacy Med Name: LISINOPRIL 40 MG TAB] 90 tablet 1    Sig: TAKE 1 TABLET BY MOUTH DAILY      Cardiovascular:  ACE Inhibitors Failed - 07/16/2020 11:37 AM      Failed - Cr in normal range and within 180 days    Creatinine  Date Value Ref Range Status  08/30/2011 0.95 0.60 - 1.30 mg/dL Final   Creatinine, Ser  Date Value Ref Range Status  04/08/2020 1.21 (H) 0.57 - 1.00 mg/dL Final          Failed - Last BP in normal range    BP Readings from Last 1 Encounters:  04/08/20 (!) 161/73          Passed - K in normal range and within 180 days    Potassium  Date Value Ref Range Status  04/08/2020 4.2 3.5 - 5.2 mmol/L Final  08/30/2011 3.8 3.5 - 5.1 mmol/L Final          Passed - Patient is not pregnant      Passed - Valid encounter within last 6 months    Recent Outpatient Visits           3 months ago Primary hypertension   [place] Jerrol Banana., MD   1 year ago Essential hypertension   [facility] Jerrol Banana., MD   2 years ago Essential hypertension   [facility] Jerrol Banana., MD   2 years ago Essential hypertension   [facility] Jerrol Banana., MD   2 years ago Pain of right hip joint   [facility] Jerrol Banana., MD       Future Appointments             In 3 months Jerrol Banana., MD [facility] [place]

## 2020-07-16 NOTE — Telephone Encounter (Signed)
Requested Prescriptions  Pending Prescriptions Disp Refills  . metoprolol succinate (TOPROL-XL) 25 MG 24 hr tablet [Pharmacy Med Name: METOPROLOL SUCCINATE ER 25 MG TAB] 90 tablet 0    Sig: TAKE 1/2 TABLET EVERY DAY     Cardiovascular:  Beta Blockers Failed - 07/16/2020 11:39 AM      Failed - Last BP in normal range    BP Readings from Last 1 Encounters:  04/08/20 (!) 161/73         Passed - Last Heart Rate in normal range    Pulse Readings from Last 1 Encounters:  04/08/20 72         Passed - Valid encounter within last 6 months    Recent Outpatient Visits          3 months ago Primary hypertension   [facility] Jerrol Banana., MD   1 year ago Essential hypertension   [facility] Jerrol Banana., MD   2 years ago Essential hypertension   [facility] Jerrol Banana., MD   2 years ago Essential hypertension   [facility] Jerrol Banana., MD   2 years ago Pain of right hip joint   [facility] Jerrol Banana., MD      Future Appointments            In 3 months Jerrol Banana., MD [facility]

## 2020-08-31 DIAGNOSIS — Z96651 Presence of right artificial knee joint: Secondary | ICD-10-CM | POA: Diagnosis not present

## 2020-10-27 ENCOUNTER — Ambulatory Visit: Payer: Medicare Other | Admitting: Family Medicine

## 2020-11-04 ENCOUNTER — Ambulatory Visit: Payer: Medicare Other | Admitting: Family Medicine

## 2021-01-11 ENCOUNTER — Other Ambulatory Visit: Payer: Self-pay | Admitting: Family Medicine

## 2021-01-11 DIAGNOSIS — I10 Essential (primary) hypertension: Secondary | ICD-10-CM

## 2021-02-28 ENCOUNTER — Other Ambulatory Visit: Payer: Self-pay | Admitting: Family Medicine

## 2021-02-28 DIAGNOSIS — E785 Hyperlipidemia, unspecified: Secondary | ICD-10-CM

## 2021-02-28 DIAGNOSIS — E7800 Pure hypercholesterolemia, unspecified: Secondary | ICD-10-CM

## 2021-02-28 DIAGNOSIS — E1169 Type 2 diabetes mellitus with other specified complication: Secondary | ICD-10-CM

## 2021-04-13 ENCOUNTER — Telehealth: Payer: Self-pay | Admitting: Family Medicine

## 2021-04-13 DIAGNOSIS — I10 Essential (primary) hypertension: Secondary | ICD-10-CM

## 2021-04-13 MED ORDER — METOPROLOL SUCCINATE ER 25 MG PO TB24: 12.5000 mg | ORAL_TABLET | Freq: Every day | ORAL | 0 refills | Status: DC

## 2021-04-13 NOTE — Telephone Encounter (Signed)
Medication Refill - Medication: metoprolol succinate (TOPROL-XL) 25 MG 24 hr tablet - will run out by this weekend.  lisinopril (ZESTRIL) 40 MG tablet  Has the patient contacted their pharmacy? No. No, more refills.   (Agent: If no, request that the patient contact the pharmacy for the refill. If patient does not wish to contact the pharmacy document the reason why and proceed with request.)   Preferred Pharmacy (with phone number or street name):   [place], [place] - [address]  [place] [address]  Phone: [phone] Fax: [phone]  Hours: Not open 24 hours    Has the patient been seen for an appointment in the last year OR does the patient have an upcoming appointment? Yes.    Agent: Please be advised that RX refills may take up to 3 business days. We ask that you follow-up with your pharmacy.

## 2021-04-18 ENCOUNTER — Other Ambulatory Visit: Payer: Self-pay | Admitting: Family Medicine

## 2021-04-18 DIAGNOSIS — I10 Essential (primary) hypertension: Secondary | ICD-10-CM

## 2021-04-18 NOTE — Telephone Encounter (Signed)
Refilled 01/11/2021 #90 with 4 refills. Requested Prescriptions  Pending Prescriptions Disp Refills   lisinopril (ZESTRIL) 40 MG tablet 90 tablet 0    Sig: Take 1 tablet (40 mg total) by mouth daily.     Cardiovascular:  ACE Inhibitors Failed - 04/18/2021  4:54 PM      Failed - Cr in normal range and within 180 days    Creatinine  Date Value Ref Range Status  08/30/2011 0.95 0.60 - 1.30 mg/dL Final   Creatinine, Ser  Date Value Ref Range Status  04/08/2020 1.21 (H) 0.57 - 1.00 mg/dL Final         Failed - K in normal range and within 180 days    Potassium  Date Value Ref Range Status  04/08/2020 4.2 3.5 - 5.2 mmol/L Final  08/30/2011 3.8 3.5 - 5.1 mmol/L Final         Failed - Last BP in normal range    BP Readings from Last 1 Encounters:  04/08/20 (!) 161/73         Failed - Valid encounter within last 6 months    Recent Outpatient Visits          1 year ago Primary hypertension   [facility] Jerrol Banana., MD   2 years ago Essential hypertension   [facility] Jerrol Banana., MD   3 years ago Essential hypertension   [facility] Jerrol Banana., MD   3 years ago Essential hypertension   [facility] Jerrol Banana., MD   3 years ago Pain of right hip joint   [facility] Jerrol Banana., MD      Future Appointments            In 3 weeks Gwyneth Sprout, [place], [place] - Patient is not pregnant

## 2021-04-18 NOTE — Telephone Encounter (Signed)
Medication Refill - Medication: lisinopril (ZESTRIL) 40 MG tablet. Patient will run out on Wednesday and states she can not get around without relying on others and the first available appointment would have to be 05/11/2021 but would like a short supply.   Has the patient contacted their pharmacy? Yes.    (Agent: If yes, when and what did the pharmacy advise?) Call PCP to schedule appointment.   Preferred Pharmacy (with phone number or street name):  [place], [place] - [place] Phone:  [phone]  Fax:  [phone]      Has the patient been seen for an appointment in the last year OR does the patient have an upcoming appointment? Yes.    Agent: Please be advised that RX refills may take up to 3 business days. We ask that you follow-up with your pharmacy.

## 2021-04-20 ENCOUNTER — Other Ambulatory Visit: Payer: Self-pay | Admitting: Family Medicine

## 2021-04-20 DIAGNOSIS — I10 Essential (primary) hypertension: Secondary | ICD-10-CM

## 2021-04-20 NOTE — Telephone Encounter (Signed)
Requested medication (s) are due for refill today:   Yes  Requested medication (s) are on the active medication list:   Yes  Future visit scheduled:   Yes 05/11/2021   Last ordered: 01/11/2021 #90, 0 refills  Returned because labs are due so per protocol unable to refill  Also been greater than 6 months since last seen.    Requested Prescriptions  Pending Prescriptions Disp Refills   lisinopril (ZESTRIL) 40 MG tablet [Pharmacy Med Name: LISINOPRIL 40 MG TAB] 90 tablet 0    Sig: TAKE 1 TABLET BY MOUTH DAILY     Cardiovascular:  ACE Inhibitors Failed - 04/20/2021 10:41 AM      Failed - Cr in normal range and within 180 days    Creatinine  Date Value Ref Range Status  08/30/2011 0.95 0.60 - 1.30 mg/dL Final   Creatinine, Ser  Date Value Ref Range Status  04/08/2020 1.21 (H) 0.57 - 1.00 mg/dL Final          Failed - K in normal range and within 180 days    Potassium  Date Value Ref Range Status  04/08/2020 4.2 3.5 - 5.2 mmol/L Final  08/30/2011 3.8 3.5 - 5.1 mmol/L Final          Failed - Last BP in normal range    BP Readings from Last 1 Encounters:  04/08/20 (!) 161/73          Failed - Valid encounter within last 6 months    Recent Outpatient Visits           1 year ago Primary hypertension   [facility] Jerrol Banana., MD   2 years ago Essential hypertension   [facility] Jerrol Banana., MD   3 years ago Essential hypertension   [facility] Jerrol Banana., MD   3 years ago Essential hypertension   [facility] Jerrol Banana., MD   3 years ago Pain of right hip joint   [facility] Jerrol Banana., MD       Future Appointments             In 3 weeks Gwyneth Sprout, [place], [place] - Patient is not pregnant

## 2021-05-10 NOTE — Progress Notes (Signed)
Established patient visit   Patient: Sharon Cooper   DOB: Sep 08, 1930   88 y.o. Female  MRN: [id number] Visit Date: 05/11/2021  Today's healthcare provider: Gwyneth Sprout, FNP   No chief complaint on file.  Subjective    HPI  Hypertension, follow-up  BP Readings from Last 3 Encounters:  05/11/21 129/80  04/08/20 (!) 161/73  01/30/18 (!) 142/80   Wt Readings from Last 3 Encounters:  04/08/20 248 lb (112.5 kg)  02/24/19 269 lb (122 kg)  01/30/18 268 lb (121.6 kg)     She was last seen for hypertension 13 months ago.  BP at that visit as above. Management since that visit includes none.  She reports good compliance with treatment. She is not having side effects.   Use of agents associated with hypertension: none.   Outside blood pressures are not being checked Symptoms: No chest pain No chest pressure  No palpitations No syncope  No dyspnea No orthopnea  No paroxysmal nocturnal dyspnea Yes lower extremity edema   Pertinent labs: Lab Results  Component Value Date   CHOL 252 (H) 04/08/2020   HDL 59 04/08/2020   LDLCALC 164 (H) 04/08/2020   TRIG 163 (H) 04/08/2020   CHOLHDL 4.3 04/08/2020   Lab Results  Component Value Date   NA 140 04/08/2020   K 4.2 04/08/2020   CREATININE 1.21 (H) 04/08/2020   GFRNONAA 40 (L) 04/08/2020   GLUCOSE 88 04/08/2020   TSH 1.540 04/08/2020     The ASCVD Risk score (Arnett DK, et al., 2019) failed to calculate for the following reasons:   The 2019 ASCVD risk score is only valid for ages 21 to 70   --------------------------------------------------------------------------------------------------- Lipid/Cholesterol, Follow-up  Last lipid panel Other pertinent labs  Lab Results  Component Value Date   CHOL 252 (H) 04/08/2020   HDL 59 04/08/2020   LDLCALC 164 (H) 04/08/2020   TRIG 163 (H) 04/08/2020   CHOLHDL 4.3 04/08/2020   Lab Results  Component Value Date   ALT 8 04/08/2020   AST 11 04/08/2020   PLT 199  04/08/2020   TSH 1.540 04/08/2020     She was last seen for this 13 months ago.  Management since that visit includes none.  She reports good compliance with treatment. She is not having side effects.   Symptoms: No chest pain No chest pressure/discomfort  No dyspnea Yes lower extremity edema  No numbness or tingling of extremity No orthopnea  No palpitations No paroxysmal nocturnal dyspnea  No speech difficulty No syncope     The ASCVD Risk score (Arnett DK, et al., 2019) failed to calculate for the following reasons:   The 2019 ASCVD risk score is only valid for ages 22 to 71  ---------------------------------------------------------------------------------------------------   Medications: Outpatient Medications Prior to Visit  Medication Sig   acetaminophen (TYLENOL) 325 MG tablet Take 2 tablets (650 mg total) by mouth every 6 (six) hours as needed.   Ascorbic Acid (VITAMIN C) 100 MG tablet Take 100 mg by mouth daily.   aspirin EC 81 MG tablet Take 81 mg by mouth daily.   Cholecalciferol (VITAMIN D-3) 1000 UNITS CAPS Take 1 capsule by mouth daily.   cyanocobalamin 100 MCG tablet Take 1 tablet by mouth daily.   Omega-3 Fatty Acids (FISH OIL BURP-LESS) 1000 MG CAPS Take 1 tablet by mouth daily.   [DISCONTINUED] amLODipine (NORVASC) 5 MG tablet TAKE 1 TABLET BY MOUTH DAILY   [DISCONTINUED] lisinopril (ZESTRIL) 40 MG  tablet TAKE 1 TABLET BY MOUTH DAILY   [DISCONTINUED] metoprolol succinate (TOPROL-XL) 25 MG 24 hr tablet Take 0.5 tablets (12.5 mg total) by mouth daily. Please schedule office visit before any future refill.   [DISCONTINUED] pravastatin (PRAVACHOL) 10 MG tablet TAKE ONE TABLET EVERY DAY   [DISCONTINUED] methylPREDNISolone acetate (DEPO-MEDROL) injection 80 mg    No facility-administered medications prior to visit.    Review of Systems  Respiratory:  Negative for cough and shortness of breath.   Cardiovascular:  Positive for leg swelling. Negative for chest  pain.  Neurological:  Negative for dizziness, light-headedness and headaches.      Objective    BP 129/80 (BP Location: Right Arm, Patient Position: Sitting, Cuff Size: Large)    Pulse 95    Temp 98.8 F (37.1 C) (Oral)    SpO2 95%    Physical Exam Vitals and nursing note reviewed.  Constitutional:      General: She is not in acute distress.    Appearance: Normal appearance. She is obese. She is not ill-appearing, toxic-appearing or diaphoretic.  HENT:     Head: Normocephalic and atraumatic.  Cardiovascular:     Rate and Rhythm: Normal rate and regular rhythm.     Pulses: Normal pulses.     Heart sounds: Murmur heard.  Systolic murmur is present with a grade of 4/6.    No friction rub. No gallop.  Pulmonary:     Effort: Pulmonary effort is normal. No respiratory distress.     Breath sounds: Normal breath sounds. No stridor. No wheezing, rhonchi or rales.  Chest:     Chest wall: No tenderness.  Abdominal:     General: Bowel sounds are normal.     Palpations: Abdomen is soft.  Musculoskeletal:        General: No swelling, tenderness, deformity or signs of injury. Normal range of motion.     Right lower leg: No edema.     Left lower leg: No edema.  Skin:    General: Skin is warm and dry.     Capillary Refill: Capillary refill takes less than 2 seconds.     Coloration: Skin is not jaundiced or pale.     Findings: No bruising, erythema, lesion or rash.  Neurological:     General: No focal deficit present.     Mental Status: She is alert and oriented to person, place, and time. Mental status is at baseline.     Cranial Nerves: No cranial nerve deficit.     Sensory: No sensory deficit.     Motor: No weakness.     Coordination: Coordination normal.  Psychiatric:        Mood and Affect: Mood normal.        Behavior: Behavior normal.        Thought Content: Thought content normal.        Judgment: Judgment normal.     No results found for any visits on 05/11/21.   Assessment & Plan     Problem List Items Addressed This Visit       Cardiovascular and Mediastinum   Heart & renal disease, hypertensive, with heart failure ([place])    One kidney d/t previous carcinoma; previous f/b [facility]      Relevant Medications   amLODipine (NORVASC) 5 MG tablet   lisinopril (ZESTRIL) 40 MG tablet   metoprolol succinate (TOPROL-XL) 25 MG 24 hr tablet   atorvastatin (LIPITOR) 80 MG tablet   Essential hypertension - Primary  Chronic, stable Denies CP Denies SOB Denies DOE No LE Edema noted on exam Continue medication Refills provided Seek emergent care if you develop CP, chest pain or chest pressure       Relevant Medications   amLODipine (NORVASC) 5 MG tablet   lisinopril (ZESTRIL) 40 MG tablet   metoprolol succinate (TOPROL-XL) 25 MG 24 hr tablet   atorvastatin (LIPITOR) 80 MG tablet     Genitourinary   Stage 3b chronic kidney disease (HCC)    Pt expressed that she does not wish to be on iHD or PD, like her husband      Relevant Orders   Comprehensive metabolic panel     Other   Elevated serum creatinine    Repeat chemistry      Relevant Orders   Comprehensive metabolic panel   Elevated LDL cholesterol level    HLD - medications: on statin, not at goal - compliance: doing well with use - medication SEs: some joint and muscle pain  The ASCVD Risk score (Arnett DK, et al., 2019) failed to calculate for the following reasons:   The 2019 ASCVD risk score is only valid for ages 81 to 23         Relevant Medications   atorvastatin (LIPITOR) 80 MG tablet   Other Relevant Orders   Lipid Panel With LDL/HDL Ratio   Prediabetes    Well controlled with last A1c; repeated today Continue current lifestyle control Refused vaccinations, eye exam, foot exam On ACEi On Statin Discussed diet and exercise F/u in 12 months; previous set by PCP      Relevant Orders   Hemoglobin A1c     Return in about 1 year (around 05/11/2022) for annual  examination.      [place] Ponder [place],acting as a scribe for Gwyneth Sprout, FNP.,have documented all relevant documentation on the behalf of Gwyneth Sprout, FNP,as directed by  Gwyneth Sprout, FNP while in the presence of Gwyneth Sprout, FNP.  Vonna Kotyk, FNP, have reviewed all documentation for this visit. The documentation on 05/11/21 for the exam, diagnosis, procedures, and orders are all accurate and complete.    Gwyneth Sprout, [place] [phone] (phone) [phone] (fax)  [place]

## 2021-05-11 ENCOUNTER — Ambulatory Visit (INDEPENDENT_AMBULATORY_CARE_PROVIDER_SITE_OTHER): Payer: Medicare Other | Admitting: Family Medicine

## 2021-05-11 ENCOUNTER — Encounter: Payer: Self-pay | Admitting: Family Medicine

## 2021-05-11 ENCOUNTER — Other Ambulatory Visit: Payer: Self-pay

## 2021-05-11 VITALS — BP 129/80 | HR 95 | Temp 98.8°F

## 2021-05-11 DIAGNOSIS — I10 Essential (primary) hypertension: Secondary | ICD-10-CM | POA: Insufficient documentation

## 2021-05-11 DIAGNOSIS — N1832 Chronic kidney disease, stage 3b: Secondary | ICD-10-CM | POA: Insufficient documentation

## 2021-05-11 DIAGNOSIS — R7989 Other specified abnormal findings of blood chemistry: Secondary | ICD-10-CM | POA: Insufficient documentation

## 2021-05-11 DIAGNOSIS — E7800 Pure hypercholesterolemia, unspecified: Secondary | ICD-10-CM | POA: Insufficient documentation

## 2021-05-11 DIAGNOSIS — I130 Hypertensive heart and chronic kidney disease with heart failure and stage 1 through stage 4 chronic kidney disease, or unspecified chronic kidney disease: Secondary | ICD-10-CM

## 2021-05-11 DIAGNOSIS — R7303 Prediabetes: Secondary | ICD-10-CM | POA: Diagnosis not present

## 2021-05-11 MED ORDER — ATORVASTATIN CALCIUM 80 MG PO TABS: 80.0000 mg | ORAL_TABLET | Freq: Every day | ORAL | 3 refills | Status: DC

## 2021-05-11 MED ORDER — AMLODIPINE BESYLATE 5 MG PO TABS: 5.0000 mg | ORAL_TABLET | Freq: Every day | ORAL | 3 refills | Status: DC

## 2021-05-11 MED ORDER — METOPROLOL SUCCINATE ER 25 MG PO TB24: 12.5000 mg | ORAL_TABLET | Freq: Every day | ORAL | 3 refills | Status: DC

## 2021-05-11 MED ORDER — LISINOPRIL 40 MG PO TABS: 40.0000 mg | ORAL_TABLET | Freq: Every day | ORAL | 3 refills | Status: DC

## 2021-05-11 NOTE — Assessment & Plan Note (Signed)
One kidney d/t previous carcinoma; previous f/b [facility]

## 2021-05-11 NOTE — Assessment & Plan Note (Signed)
Pt expressed that she does not wish to be on iHD or PD, like her husband

## 2021-05-11 NOTE — Assessment & Plan Note (Signed)
HLD - medications: on statin, not at goal - compliance: doing well with use - medication SEs: some joint and muscle pain  The ASCVD Risk score (Arnett DK, et al., 2019) failed to calculate for the following reasons:   The 2019 ASCVD risk score is only valid for ages 3 to 39

## 2021-05-11 NOTE — Assessment & Plan Note (Signed)
Repeat chemistry

## 2021-05-11 NOTE — Assessment & Plan Note (Signed)
Chronic, stable ?Denies CP ?Denies SOB ?Denies DOE ?No LE Edema noted on exam ?Continue medication ?Refills provided ?Seek emergent care if you develop CP, chest pain or chest pressure ? ?

## 2021-05-11 NOTE — Assessment & Plan Note (Signed)
Well controlled with last A1c; repeated today Continue current lifestyle control Refused vaccinations, eye exam, foot exam On ACEi On Statin Discussed diet and exercise F/u in 12 months; previous set by PCP

## 2021-05-12 LAB — COMPREHENSIVE METABOLIC PANEL
ALT: 9 IU/L (ref 0–32)
AST: 8 IU/L (ref 0–40)
Albumin/Globulin Ratio: 1.8 (ref 1.2–2.2)
Albumin: 4.1 g/dL (ref 3.6–4.6)
Alkaline Phosphatase: 93 IU/L (ref 44–121)
BUN/Creatinine Ratio: 21 (ref 12–28)
BUN: 26 mg/dL (ref 8–27)
Bilirubin Total: 0.3 mg/dL (ref 0.0–1.2)
CO2: 19 mmol/L — ABNORMAL LOW (ref 20–29)
Calcium: 9.7 mg/dL (ref 8.7–10.3)
Chloride: 102 mmol/L (ref 96–106)
Creatinine, Ser: 1.21 mg/dL — ABNORMAL HIGH (ref 0.57–1.00)
Globulin, Total: 2.3 g/dL (ref 1.5–4.5)
Glucose: 103 mg/dL — ABNORMAL HIGH (ref 70–99)
Potassium: 4.5 mmol/L (ref 3.5–5.2)
Sodium: 137 mmol/L (ref 134–144)
Total Protein: 6.4 g/dL (ref 6.0–8.5)
eGFR: 43 mL/min/{1.73_m2} — ABNORMAL LOW (ref >59)

## 2021-05-12 LAB — LIPID PANEL WITH LDL/HDL RATIO
Cholesterol, Total: 229 mg/dL — ABNORMAL HIGH (ref 100–199)
HDL: 62 mg/dL (ref >39)
LDL Chol Calc (NIH): 152 mg/dL — ABNORMAL HIGH (ref 0–99)
LDL/HDL Ratio: 2.5 ratio (ref 0.0–3.2)
Triglycerides: 85 mg/dL (ref 0–149)
VLDL Cholesterol Cal: 15 mg/dL (ref 5–40)

## 2021-05-12 LAB — HEMOGLOBIN A1C
Est. average glucose Bld gHb Est-mCnc: 123 mg/dL
Hgb A1c MFr Bld: 5.9 % — ABNORMAL HIGH (ref 4.8–5.6)

## 2021-05-16 ENCOUNTER — Ambulatory Visit: Payer: Medicare Other | Admitting: Family Medicine

## 2021-05-16 ENCOUNTER — Ambulatory Visit: Payer: Self-pay | Admitting: *Deleted

## 2021-05-16 DIAGNOSIS — J069 Acute upper respiratory infection, unspecified: Secondary | ICD-10-CM

## 2021-05-16 MED ORDER — BENZONATATE 100 MG PO CAPS: 100.0000 mg | ORAL_CAPSULE | Freq: Two times a day (BID) | ORAL | 0 refills | Status: DC | PRN

## 2021-05-16 NOTE — Progress Notes (Signed)
Virtual Visit via Telephone Note  I connected with Sharon Cooper on 05/16/21 at  3:00 PM EST by telephone and verified that I am speaking with the correct person using two identifiers.  Location: Patient: home Provider: [facility]   I discussed the limitations, risks, security and privacy concerns of performing an evaluation and management service by telephone and the availability of in person appointments. I also discussed with the patient that there may be a patient responsible charge related to this service. The patient expressed understanding and agreed to proceed.   History of Present Illness:  UPPER RESPIRATORY TRACT INFECTION - symptom onset 05/13/21 - has not tested for COVID  Fever: no Cough: yes, non productive Shortness of breath: no Chest pain: no Chest tightness: no Nasal congestion: yes Runny nose: yes Sore throat: yes Sinus pressure: yes Ear pain: no  Ear pressure: no  Vomiting: no Sick contacts: no Relief with OTC cold/cough medications: yes  Treatments attempted:  alka seltzer plus    Observations/Objective:  Entirety of visit conducted over the phone.  Speaks in full sentences, no respiratory distress. Frequent dry coughing.   Assessment and Plan:  URI Mild-mod sx. Refusing COVID testing, otherwise would be a candidate for antiviral treatment if positive. Reviewed OTC symptom relief, self-quarantine guidelines, and emergency precautions.     I discussed the assessment and treatment plan with the patient. The patient was provided an opportunity to ask questions and all were answered. The patient agreed with the plan and demonstrated an understanding of the instructions.   The patient was advised to call back or seek an in-person evaluation if the symptoms worsen or if the condition fails to improve as anticipated.  I provided 12 minutes of non-face-to-face time during this encounter.   Myles Gip, DO

## 2021-05-16 NOTE — Telephone Encounter (Signed)
Pt son requests call back as pt had a virtual appt today but the prescription was not received by the pharmacy. Pt son very upset that the Rx was sent to pharmacy.Cb# [phone]      Called pharmacy, med is ready for pick up (tessalon)  CAlled son to advise. Reason for Disposition  [1] Prescription not at pharmacy AND [2] was prescribed by PCP recently (Exception: triager has access to EMR and prescription is recorded there. Go to Home Care and confirm for pharmacy.)  Protocols used: Medication Question Call-A-AH

## 2021-05-16 NOTE — Patient Instructions (Signed)
You have a cold and it should start to get better about 7 - 10 days after it started.    If you test positive for COVID, let us know. You would be a candidate for one of the antivirals used to treat COVID.  For your cough, try the tessalon.   For your nasal congestion and runny nose, try using Afrin (generic is Oxymetazoline) twice daily for 3 days.  Do not use for longer that 3 days.    Some other therapies you can try are: push fluids, rest, gargle warm salt water, use vaporizer or mist as needed, apply heat to sinuses as needed, and return office visit if symptoms persist or worsen.   Drinking warm liquids such as teas and soups can help with secretions and cough. A mist humidifier or vaporizer can work well to help with secretions and cough.  It is very important to clean the humidifier between use according to the instructions.    It was good to see you.  If you're still having trouble in the next week, come back and see [place].    Of course, if you start having trouble breathing, worsening fevers, vomiting and unable to hold down any fluids, or you have other concerns, don't hesitate to come back or go to the ED after hours.

## 2021-05-21 DIAGNOSIS — U071 COVID-19: Secondary | ICD-10-CM | POA: Diagnosis not present

## 2021-05-21 DIAGNOSIS — J40 Bronchitis, not specified as acute or chronic: Secondary | ICD-10-CM | POA: Diagnosis not present

## 2021-05-26 ENCOUNTER — Telehealth: Payer: Self-pay | Admitting: Family Medicine

## 2021-05-26 NOTE — Telephone Encounter (Signed)
Patient declined the Medicare Wellness Visit with [facility] . Offered in office or by phone.  Stated unable to schedule anything at this time  stated family helps her with scheduling ? ?Told her I would call back in a couple of months ?

## 2021-05-26 NOTE — Telephone Encounter (Signed)
Pt is calling she is missed a call. Please advise  ?

## 2021-07-07 ENCOUNTER — Telehealth: Payer: Self-pay | Admitting: Family Medicine

## 2021-07-07 ENCOUNTER — Other Ambulatory Visit: Payer: Self-pay

## 2021-07-07 DIAGNOSIS — I10 Essential (primary) hypertension: Secondary | ICD-10-CM

## 2021-07-07 DIAGNOSIS — E7800 Pure hypercholesterolemia, unspecified: Secondary | ICD-10-CM

## 2021-07-07 MED ORDER — ATORVASTATIN CALCIUM 80 MG PO TABS: 80.0000 mg | ORAL_TABLET | Freq: Every day | ORAL | 3 refills | Status: DC

## 2021-07-07 MED ORDER — METOPROLOL SUCCINATE ER 25 MG PO TB24: 12.5000 mg | ORAL_TABLET | Freq: Every day | ORAL | 3 refills | Status: DC

## 2021-07-07 MED ORDER — LISINOPRIL 40 MG PO TABS: 40.0000 mg | ORAL_TABLET | Freq: Every day | ORAL | 3 refills | Status: DC

## 2021-07-07 MED ORDER — AMLODIPINE BESYLATE 5 MG PO TABS: 5.0000 mg | ORAL_TABLET | Freq: Every day | ORAL | 3 refills | Status: DC

## 2021-10-06 ENCOUNTER — Telehealth: Payer: Self-pay | Admitting: Family Medicine

## 2021-10-06 DIAGNOSIS — I10 Essential (primary) hypertension: Secondary | ICD-10-CM

## 2021-10-06 NOTE — Telephone Encounter (Signed)
Medication Refill - Medication: metoprolol succinate (TOPROL-XL) 25 MG 24 hr tablet [[id number]  Has the patient contacted their pharmacy? Yes.   (Agent: If no, request that the patient contact the pharmacy for the refill. If patient does not wish to contact the pharmacy document the reason why and proceed with request.) (Agent: If yes, when and what did the pharmacy advise?)  Preferred Pharmacy (with phone number or street name):  [place], [place] - [place]  [place] [address]  Phone:   Has the patient been seen for an appointment in the last year OR does the patient have an upcoming appointment? Yes.    Agent: Please be advised that RX refills may take up to 3 business days. We ask that you follow-up with your pharmacy.

## 2021-10-06 NOTE — Telephone Encounter (Signed)
[facility] called and spoke to [facility], Technician about the refill(s) Metoprolol requested. Advised it was sent on 07/07/21 #45/3 refill(s). She says they have it on file and will get it ready for the patient.

## 2021-10-11 DIAGNOSIS — Z96651 Presence of right artificial knee joint: Secondary | ICD-10-CM | POA: Diagnosis not present

## 2021-10-11 DIAGNOSIS — M5416 Radiculopathy, lumbar region: Secondary | ICD-10-CM | POA: Diagnosis not present

## 2021-11-08 ENCOUNTER — Ambulatory Visit: Payer: Medicare Other | Admitting: Family Medicine

## 2021-11-08 ENCOUNTER — Encounter: Payer: Self-pay | Admitting: Family Medicine

## 2021-11-08 VITALS — BP 100/60 | HR 76 | Resp 16

## 2021-11-08 DIAGNOSIS — Z85528 Personal history of other malignant neoplasm of kidney: Secondary | ICD-10-CM

## 2021-11-08 DIAGNOSIS — M170 Bilateral primary osteoarthritis of knee: Secondary | ICD-10-CM

## 2021-11-08 DIAGNOSIS — N1832 Chronic kidney disease, stage 3b: Secondary | ICD-10-CM

## 2021-11-08 DIAGNOSIS — Z6841 Body Mass Index (BMI) 40.0 and over, adult: Secondary | ICD-10-CM

## 2021-11-08 DIAGNOSIS — Z96651 Presence of right artificial knee joint: Secondary | ICD-10-CM

## 2021-11-08 DIAGNOSIS — E7800 Pure hypercholesterolemia, unspecified: Secondary | ICD-10-CM

## 2021-11-08 DIAGNOSIS — I10 Essential (primary) hypertension: Secondary | ICD-10-CM | POA: Diagnosis not present

## 2021-11-08 DIAGNOSIS — R7303 Prediabetes: Secondary | ICD-10-CM

## 2021-11-08 MED ORDER — PRAVASTATIN SODIUM 40 MG PO TABS: 40.0000 mg | ORAL_TABLET | Freq: Every day | ORAL | 3 refills | Status: DC

## 2021-11-08 NOTE — Patient Instructions (Signed)
Stop atorvastatin and in a couple of weeks restart pravastatin which has been sent to pharmacy.

## 2021-11-08 NOTE — Progress Notes (Unsigned)
Established patient visit  I,Sharon Cooper,acting as a scribe for Sharon Durie, MD.,have documented all relevant documentation on the behalf of Sharon Durie, MD,as directed by  Sharon Durie, MD while in the presence of Sharon Durie, MD.   Patient: Sharon Cooper   DOB: Jun 07, 1930   88 y.o. Female  MRN: [id number] Visit Date: 11/08/2021  Today's healthcare provider: Wilhemena Durie, MD   Chief Complaint  Patient presents with   Follow-up   Hypertension   Subjective    HPI  Hypertension, follow-up  BP Readings from Last 3 Encounters:  11/08/21 100/60  05/11/21 129/80  04/08/20 (!) 161/73   Wt Readings from Last 3 Encounters:  04/08/20 248 lb (112.5 kg)  02/24/19 269 lb (122 kg)  01/30/18 268 lb (121.6 kg)     She was last seen for hypertension 6 months ago.  Management since that visit includes; stable.  Outside blood pressures are machine is not working.  Pertinent labs Lab Results  Component Value Date   CHOL 229 (H) 05/11/2021   HDL 62 05/11/2021   LDLCALC 152 (H) 05/11/2021   TRIG 85 05/11/2021   CHOLHDL 4.3 04/08/2020   Lab Results  Component Value Date   NA 137 05/11/2021   K 4.5 05/11/2021   CREATININE 1.21 (H) 05/11/2021   EGFR 43 (L) 05/11/2021   GLUCOSE 103 (H) 05/11/2021   TSH 1.540 04/08/2020     The ASCVD Risk score (Arnett DK, et al., 2019) failed to calculate for the following reasons:   The 2019 ASCVD risk score is only valid for ages 1 to 50  ---------------------------------------------------------------------------------------------------   Medications: Outpatient Medications Prior to Visit  Medication Sig   acetaminophen (TYLENOL) 325 MG tablet Take 2 tablets (650 mg total) by mouth every 6 (six) hours as needed.   amLODipine (NORVASC) 5 MG tablet Take 1 tablet (5 mg total) by mouth daily.   Ascorbic Acid (VITAMIN C) 100 MG tablet Take 100 mg by mouth daily.   aspirin EC 81 MG tablet Take 81 mg by  mouth daily.   atorvastatin (LIPITOR) 80 MG tablet Take 1 tablet (80 mg total) by mouth daily.   benzonatate (TESSALON) 100 MG capsule Take 1 capsule (100 mg total) by mouth 2 (two) times daily as needed for cough.   Cholecalciferol (VITAMIN D-3) 1000 UNITS CAPS Take 1 capsule by mouth daily.   cyanocobalamin 100 MCG tablet Take 1 tablet by mouth daily.   lisinopril (ZESTRIL) 40 MG tablet Take 1 tablet (40 mg total) by mouth daily.   metoprolol succinate (TOPROL-XL) 25 MG 24 hr tablet Take 0.5 tablets (12.5 mg total) by mouth daily.   Omega-3 Fatty Acids (FISH OIL BURP-LESS) 1000 MG CAPS Take 1 tablet by mouth daily.   No facility-administered medications prior to visit.    Review of Systems  Constitutional:  Negative for appetite change, chills, fatigue and fever.  Respiratory:  Negative for chest tightness and shortness of breath.   Cardiovascular:  Negative for chest pain and palpitations.  Gastrointestinal:  Negative for abdominal pain, nausea and vomiting.  Neurological:  Negative for dizziness and weakness.    Last hemoglobin A1c Lab Results  Component Value Date   HGBA1C 5.9 (H) 05/11/2021       Objective    BP 100/60 (BP Location: Left Arm, Patient Position: Sitting, Cuff Size: Large)   Pulse 76   Resp 16   SpO2 96%  BP Readings from Last  3 Encounters:  11/08/21 100/60  05/11/21 129/80  04/08/20 (!) 161/73   Wt Readings from Last 3 Encounters:  04/08/20 248 lb (112.5 kg)  02/24/19 269 lb (122 kg)  01/30/18 268 lb (121.6 kg)      Physical Exam  ***  No results found for any visits on 11/08/21.  Assessment & Plan     1. Essential hypertension ***   Return in about 6 months (around 05/11/2022).      {provider attestation***:1}   Sharon Durie, MD  [facility] [phone] (phone) [phone] (fax)  [place]

## 2021-11-23 DIAGNOSIS — H524 Presbyopia: Secondary | ICD-10-CM | POA: Diagnosis not present

## 2022-04-18 ENCOUNTER — Telehealth: Payer: Self-pay | Admitting: Family Medicine

## 2022-04-18 NOTE — Telephone Encounter (Signed)
[facility] pharmacy faxed refill request for the following medications:  lisinopril (ZESTRIL) 40 MG tablet    Please advise

## 2022-04-20 ENCOUNTER — Telehealth: Payer: Self-pay | Admitting: Family Medicine

## 2022-04-20 NOTE — Telephone Encounter (Signed)
LVM for pt to rtn my call to schedule AWV with [facility] call back, # [phone], please schedule if patient calls the office.

## 2022-04-20 NOTE — Telephone Encounter (Signed)
Patient is calling back Please advise CB- [phone]

## 2022-05-15 ENCOUNTER — Encounter: Payer: Self-pay | Admitting: Family Medicine

## 2022-05-15 ENCOUNTER — Ambulatory Visit: Payer: Medicare Other | Admitting: Family Medicine

## 2022-05-15 ENCOUNTER — Ambulatory Visit (INDEPENDENT_AMBULATORY_CARE_PROVIDER_SITE_OTHER): Payer: Medicare Other | Admitting: Family Medicine

## 2022-05-15 VITALS — BP 118/81 | HR 93 | Resp 16

## 2022-05-15 DIAGNOSIS — N1832 Chronic kidney disease, stage 3b: Secondary | ICD-10-CM | POA: Diagnosis not present

## 2022-05-15 DIAGNOSIS — E7800 Pure hypercholesterolemia, unspecified: Secondary | ICD-10-CM

## 2022-05-15 DIAGNOSIS — I10 Essential (primary) hypertension: Secondary | ICD-10-CM

## 2022-05-15 MED ORDER — METOPROLOL SUCCINATE ER 25 MG PO TB24: 12.5000 mg | ORAL_TABLET | Freq: Every day | ORAL | 3 refills | Status: DC

## 2022-05-15 MED ORDER — LISINOPRIL 40 MG PO TABS: 40.0000 mg | ORAL_TABLET | Freq: Every day | ORAL | 3 refills | Status: DC

## 2022-05-15 MED ORDER — PRAVASTATIN SODIUM 40 MG PO TABS: 40.0000 mg | ORAL_TABLET | Freq: Every day | ORAL | 3 refills | Status: DC

## 2022-05-15 NOTE — Assessment & Plan Note (Signed)
Chronic  Will ordered updated CMP to check creatinine and electrolyte levels  Patient has bilateral LE edema

## 2022-05-15 NOTE — Progress Notes (Signed)
I,Sharon Cooper,acting as a scribe for [employer], MD.,have documented all relevant documentation on the behalf of Sharon Foster, MD,as directed by  Sharon Foster, MD while in the presence of Sharon Foster, MD.   Established patient visit   Patient: Sharon Cooper   DOB: [birth year]   89 y.o. Female  MRN: [id number] Visit Date: 05/15/2022  Today's healthcare provider: [name], MD   Chief Complaint  Patient presents with   follow-up chronic disease   Subjective    HPI  Hypertension, follow-up  BP Readings from Last 3 Encounters:  05/15/22 118/81  11/08/21 100/60  05/11/21 129/80   Wt Readings from Last 3 Encounters:  04/08/20 248 lb (112.5 kg)  02/24/19 269 lb (122 kg)  01/30/18 268 lb (121.6 kg)     She was last seen for hypertension 6 months ago.  BP at that visit was 100/60. Management since that visit includes Consider stopping amlodipine in the future or cutting back on meds . She is only taking Lisinopril 40 mg  She reports excellent compliance with treatment. She is not having side effects.    Outside blood pressures are controlled. Symptoms: No chest pain No chest pressure  No palpitations No syncope  No dyspnea No orthopnea  No paroxysmal nocturnal dyspnea No lower extremity edema   Pertinent labs Lab Results  Component Value Date   CHOL 229 (H) 05/11/2021   HDL 62 05/11/2021   LDLCALC 152 (H) 05/11/2021   TRIG 85 05/11/2021   CHOLHDL 4.3 04/08/2020   Lab Results  Component Value Date   NA 137 05/11/2021   K 4.5 05/11/2021   CREATININE 1.21 (H) 05/11/2021   EGFR 43 (L) 05/11/2021   GLUCOSE 103 (H) 05/11/2021   TSH 1.540 04/08/2020     The ASCVD Risk score (Arnett DK, et al., 2019) failed to calculate for the following reasons:   The 2019 ASCVD risk score is only valid for ages 51 to  59  --------------------------------------------------------------------------------------------------- Lipid/Cholesterol, Follow-up  Last lipid panel Other pertinent labs  Lab Results  Component Value Date   CHOL 229 (H) 05/11/2021   HDL 62 05/11/2021   LDLCALC 152 (H) 05/11/2021   TRIG 85 05/11/2021   CHOLHDL 4.3 04/08/2020   Lab Results  Component Value Date   ALT 9 05/11/2021   AST 8 05/11/2021   PLT 199 04/08/2020   TSH 1.540 04/08/2020     She was last seen for this 6 months ago.  Management since that visit includes continuing Pravastatin  She reports excellent compliance with treatment. She is not having side effects.   The ASCVD Risk score (Arnett DK, et al., 2019) failed to calculate for the following reasons:   The 2019 ASCVD risk score is only valid for ages 28 to 52  ---------------------------------------------------------------------------------------------------   Medications: Outpatient Medications Prior to Visit  Medication Sig   acetaminophen (TYLENOL) 325 MG tablet Take 2 tablets (650 mg total) by mouth every 6 (six) hours as needed.   Ascorbic Acid (VITAMIN C) 100 MG tablet Take 100 mg by mouth daily.   aspirin EC 81 MG tablet Take 81 mg by mouth daily.   Cholecalciferol (VITAMIN D-3) 1000 UNITS CAPS Take 1 capsule by mouth daily.   cyanocobalamin 100 MCG tablet Take 1 tablet by mouth daily.   Omega-3 Fatty Acids (FISH OIL BURP-LESS) 1000 MG CAPS Take 1 tablet by mouth daily.   [DISCONTINUED] lisinopril (ZESTRIL) 40 MG tablet Take 1 tablet (40 mg total)  by mouth daily.   [DISCONTINUED] metoprolol succinate (TOPROL-XL) 25 MG 24 hr tablet Take 0.5 tablets (12.5 mg total) by mouth daily.   [DISCONTINUED] pravastatin (PRAVACHOL) 40 MG tablet Take 1 tablet (40 mg total) by mouth daily.   [DISCONTINUED] amLODipine (NORVASC) 5 MG tablet Take 1 tablet (5 mg total) by mouth daily.   [DISCONTINUED] atorvastatin (LIPITOR) 80 MG tablet Take 1 tablet (80 mg  total) by mouth daily.   [DISCONTINUED] benzonatate (TESSALON) 100 MG capsule Take 1 capsule (100 mg total) by mouth 2 (two) times daily as needed for cough.   No facility-administered medications prior to visit.    Review of Systems     Objective    BP 118/81 (BP Location: Right Arm, Patient Position: Sitting, Cuff Size: Large)   Pulse 93   Resp 16    Physical Exam Vitals reviewed.  Constitutional:      General: She is not in acute distress.    Appearance: Normal appearance. She is obese. She is not ill-appearing, toxic-appearing or diaphoretic.     Comments: Elderly female sitting calmly in transport chair, pleasant demeanor    Eyes:     Conjunctiva/sclera: Conjunctivae normal.  Cardiovascular:     Rate and Rhythm: Normal rate and regular rhythm.     Pulses: Normal pulses.     Heart sounds: Normal heart sounds. No murmur heard.    No friction rub. No gallop.  Pulmonary:     Effort: Pulmonary effort is normal. No respiratory distress.     Breath sounds: Normal breath sounds. No stridor. No wheezing, rhonchi or rales.  Abdominal:     General: Bowel sounds are normal. There is no distension.     Palpations: Abdomen is soft.     Tenderness: There is no abdominal tenderness.  Musculoskeletal:     Right lower leg: Edema present.     Left lower leg: Edema present.  Skin:    Findings: No erythema or rash.  Neurological:     Mental Status: She is alert and oriented to person, place, and time.       No results found for any visits on 05/15/22.  Assessment & Plan     Problem List Items Addressed This Visit       Cardiovascular and Mediastinum   Essential hypertension - Primary    Controlled BP at goal Continue metoprolol 12.79m daily and lisinopril 475mdaily  No medications changes today  CMP ordered  Refills provided for lisinopril, metoprolol DC amlodipine as patient is no longer taking this medication      Relevant Medications   metoprolol succinate  (TOPROL-XL) 25 MG 24 hr tablet   pravastatin (PRAVACHOL) 40 MG tablet   lisinopril (ZESTRIL) 40 MG tablet   Other Relevant Orders   Comprehensive metabolic panel     Genitourinary   Stage [place] chronic [place] disease [place]   Chronic  Will ordered updated CMP to check creatinine and electrolyte levels  Patient has bilateral LE edema      Relevant Orders   Comprehensive metabolic panel     Other   Hypercholesteremia     Chronic Stable  She will continue pravastatin 4015maily  Will order direct LDL panel today  Refills provided for pravastatin 14m3m     Relevant Medications   metoprolol succinate (TOPROL-XL) 25 MG 24 hr tablet   pravastatin (PRAVACHOL) 40 MG tablet   lisinopril (ZESTRIL) 40 MG tablet   Other Relevant Orders   Direct  LDL     Return in about 6 months (around 11/13/2022) for HTN .        The entirety of the information documented in the History of Present Illness, Review of Systems and Physical Exam were personally obtained by me. Portions of this information were initially documented by Lyndel Pleasure, CMA and reviewed by me for thoroughness and accuracy.Sharon Foster, MD     Sharon Foster, MD  [facility] [phone] (phone) [phone] (fax)  [place]

## 2022-05-15 NOTE — Assessment & Plan Note (Addendum)
Controlled BP at goal Continue metoprolol 12.26m daily and lisinopril 432mdaily  No medications changes today  CMP ordered  Refills provided for lisinopril, metoprolol DC amlodipine as patient is no longer taking this medication

## 2022-05-15 NOTE — Assessment & Plan Note (Addendum)
Chronic Stable  She will continue pravastatin 86m daily  Will order direct LDL panel today  Refills provided for pravastatin 467m

## 2022-05-16 LAB — COMPREHENSIVE METABOLIC PANEL
ALT: 8 IU/L (ref 0–32)
AST: 11 IU/L (ref 0–40)
Albumin/Globulin Ratio: 1.7 (ref 1.2–2.2)
Albumin: 3.9 g/dL (ref 3.7–4.7)
Alkaline Phosphatase: 92 IU/L (ref 44–121)
BUN/Creatinine Ratio: 18 (ref 12–28)
BUN: 20 mg/dL (ref 8–27)
Bilirubin Total: 0.3 mg/dL (ref 0.0–1.2)
CO2: 18 mmol/L — ABNORMAL LOW (ref 20–29)
Calcium: 9.8 mg/dL (ref 8.7–10.3)
Chloride: 103 mmol/L (ref 96–106)
Creatinine, Ser: 1.11 mg/dL — ABNORMAL HIGH (ref 0.57–1.00)
Globulin, Total: 2.3 g/dL (ref 1.5–4.5)
Glucose: 126 mg/dL — ABNORMAL HIGH (ref 70–99)
Potassium: 3.8 mmol/L (ref 3.5–5.2)
Sodium: 136 mmol/L (ref 134–144)
Total Protein: 6.2 g/dL (ref 6.0–8.5)
eGFR: 48 mL/min/{1.73_m2} — ABNORMAL LOW (ref >59)

## 2022-05-16 LAB — LDL CHOLESTEROL, DIRECT: LDL Direct: 110 mg/dL — ABNORMAL HIGH (ref 0–99)

## 2022-11-13 ENCOUNTER — Ambulatory Visit: Payer: Medicare Other | Admitting: Family Medicine

## 2023-04-06 ENCOUNTER — Other Ambulatory Visit: Payer: Self-pay | Admitting: Family Medicine

## 2023-04-06 DIAGNOSIS — I10 Essential (primary) hypertension: Secondary | ICD-10-CM

## 2023-07-09 ENCOUNTER — Other Ambulatory Visit: Payer: Self-pay | Admitting: Family Medicine

## 2023-07-09 DIAGNOSIS — I10 Essential (primary) hypertension: Secondary | ICD-10-CM

## 2023-07-09 MED ORDER — LISINOPRIL 40 MG PO TABS: 40.0000 mg | ORAL_TABLET | Freq: Every day | ORAL | 0 refills | Status: DC

## 2023-07-09 MED ORDER — METOPROLOL SUCCINATE ER 25 MG PO TB24: 12.5000 mg | ORAL_TABLET | Freq: Every day | ORAL | 0 refills | Status: DC

## 2023-07-09 MED ORDER — PRAVASTATIN SODIUM 40 MG PO TABS: 40.0000 mg | ORAL_TABLET | Freq: Every day | ORAL | 0 refills | Status: DC

## 2023-08-09 ENCOUNTER — Other Ambulatory Visit: Payer: Self-pay | Admitting: Family Medicine

## 2023-08-09 DIAGNOSIS — I10 Essential (primary) hypertension: Secondary | ICD-10-CM

## 2023-08-22 ENCOUNTER — Telehealth: Payer: Self-pay

## 2023-08-22 ENCOUNTER — Encounter: Payer: Self-pay | Admitting: Physician Assistant

## 2023-08-22 ENCOUNTER — Other Ambulatory Visit: Payer: Self-pay

## 2023-08-22 ENCOUNTER — Ambulatory Visit: Admitting: Physician Assistant

## 2023-08-22 VITALS — BP 111/72 | HR 58 | Resp 16

## 2023-08-22 DIAGNOSIS — E7800 Pure hypercholesterolemia, unspecified: Secondary | ICD-10-CM

## 2023-08-22 DIAGNOSIS — M79604 Pain in right leg: Secondary | ICD-10-CM

## 2023-08-22 DIAGNOSIS — M79605 Pain in left leg: Secondary | ICD-10-CM

## 2023-08-22 DIAGNOSIS — I10 Essential (primary) hypertension: Secondary | ICD-10-CM | POA: Diagnosis not present

## 2023-08-22 DIAGNOSIS — N1832 Chronic kidney disease, stage 3b: Secondary | ICD-10-CM | POA: Diagnosis not present

## 2023-08-22 DIAGNOSIS — M19042 Primary osteoarthritis, left hand: Secondary | ICD-10-CM

## 2023-08-22 DIAGNOSIS — M19041 Primary osteoarthritis, right hand: Secondary | ICD-10-CM | POA: Diagnosis not present

## 2023-08-22 DIAGNOSIS — Z905 Acquired absence of kidney: Secondary | ICD-10-CM | POA: Insufficient documentation

## 2023-08-22 DIAGNOSIS — R011 Cardiac murmur, unspecified: Secondary | ICD-10-CM

## 2023-08-22 MED ORDER — METOPROLOL SUCCINATE ER 25 MG PO TB24: 12.5000 mg | ORAL_TABLET | Freq: Every day | ORAL | 1 refills | Status: AC

## 2023-08-22 MED ORDER — METOPROLOL SUCCINATE ER 25 MG PO TB24: 25.0000 mg | ORAL_TABLET | Freq: Every day | ORAL | 1 refills | Status: DC

## 2023-08-22 MED ORDER — PRAVASTATIN SODIUM 40 MG PO TABS: 40.0000 mg | ORAL_TABLET | Freq: Every day | ORAL | 1 refills | Status: AC

## 2023-08-22 MED ORDER — LISINOPRIL 40 MG PO TABS: 40.0000 mg | ORAL_TABLET | Freq: Every day | ORAL | 1 refills | Status: DC

## 2023-08-22 NOTE — Telephone Encounter (Unsigned)
 Copied from CRM [id number]. Topic: Clinical - Prescription Issue >> Aug 22, 2023  1:57 PM Carlatta H wrote: Reason for CRM: Prescription for metoprolol  succinate (TOPROL -XL) 25 MG 24 hr tablet [[id number]] was sent in with 2 sets of directions//Please correct and resend to pharmacy

## 2023-08-22 NOTE — Progress Notes (Signed)
 Established patient visit  Patient: Sharon Cooper   DOB: 06/03/1930   89 y.o. Female  MRN: [id number] Visit Date: 08/22/2023  Today's healthcare provider: Blane Bunting, PA-C   Chief Complaint  Patient presents with   Difficulty Walking    Difficulty walking, medication questions   Subjective      Discussed the use of AI scribe software for clinical note transcription with the patient, who gave verbal consent to proceed.  History of Present Illness Sharon Cooper is a 89 year old female who presents for medication refills and blood work. She is accompanied by her granddaughter.  She experiences mobility issues, using a walker and requiring a wheelchair for longer distances, which complicates appointment coordination. She takes metoprolol  25 mg, halved, and wishes to continue this effective regimen. She has arthritis causing significant pain, especially in cold weather, and uses Voltaren gel for relief, though it affects her grip. She has undergone physical therapy and exercises. She had a knee replacement about ten years ago. She has a heart murmur since childhood but no recent heart failure diagnosis. She has one kidney, having had one removed due to cancer, and avoids certain medications to protect her kidney health. She denies symptoms of depression or anxiety and reports no chest pain, shortness of breath, or palpitations. She notes soreness in her ankles and painful neuropathy in her legs when pressed.       08/22/2023    2:18 PM 05/15/2022    1:40 PM 05/11/2021    2:17 PM  Depression screen PHQ 2/9  Decreased Interest 0 0 0  Down, Depressed, Hopeless 0 0 0  PHQ - 2 Score 0 0 0  Altered sleeping 0 0 0  Tired, decreased energy 0 0 0  Change in appetite 0 0 0  Feeling bad or failure about yourself  0 0 0  Trouble concentrating 0 0 0  Moving slowly or fidgety/restless 0 0 0  Suicidal thoughts 0 0 0  PHQ-9 Score 0 0 0  Difficult doing work/chores Not difficult at all Not  difficult at all Not difficult at all      08/22/2023    2:18 PM 02/24/2019    3:38 PM  GAD 7 : Generalized Anxiety Score  Nervous, Anxious, on Edge 0 2  Control/stop worrying 0 2  Worry too much - different things 0 2  Trouble relaxing 0 0  Restless 0 2  Easily annoyed or irritable 0 0  Afraid - awful might happen 0 0  Total GAD 7 Score 0 8  Anxiety Difficulty Not difficult at all Somewhat difficult    Medications: Outpatient Medications Prior to Visit  Medication Sig   acetaminophen  (TYLENOL ) 325 MG tablet Take 2 tablets (650 mg total) by mouth every 6 (six) hours as needed.   Ascorbic Acid (VITAMIN C) 100 MG tablet Take 100 mg by mouth daily.   aspirin EC 81 MG tablet Take 81 mg by mouth daily.   Cholecalciferol (VITAMIN D-3) 1000 UNITS CAPS Take 1 capsule by mouth daily.   cyanocobalamin 100 MCG tablet Take 1 tablet by mouth daily.   Omega-3 Fatty Acids (FISH OIL BURP-LESS) 1000 MG CAPS Take 1 tablet by mouth daily.   [DISCONTINUED] lisinopril  (ZESTRIL ) 40 MG tablet TAKE 1 TABLET BY MOUTH ONCE DAILY   [DISCONTINUED] metoprolol  succinate (TOPROL -XL) 25 MG 24 hr tablet TAKE 1/2 TABLET BY MOUTH DAILY AS DIRECTED.   [DISCONTINUED] pravastatin  (PRAVACHOL ) 40 MG tablet TAKE 1 TABLET BY MOUTH ONCE DAILY  No facility-administered medications prior to visit.    Review of Systems All negative Except see HPI       Objective    BP 111/72 (BP Location: Left Arm, Patient Position: Sitting)   Pulse (!) 58   Resp 16   SpO2 98%     Physical Exam Vitals reviewed.  Constitutional:      General: She is not in acute distress.    Appearance: Normal appearance. She is well-developed. She is not diaphoretic.  HENT:     Head: Normocephalic and atraumatic.  Eyes:     General: No scleral icterus.    Conjunctiva/sclera: Conjunctivae normal.  Neck:     Thyroid : No thyromegaly.  Cardiovascular:     Rate and Rhythm: Normal rate and regular rhythm.     Pulses: Normal pulses.      Heart sounds: Normal heart sounds. No murmur heard. Pulmonary:     Effort: Pulmonary effort is normal. No respiratory distress.     Breath sounds: Normal breath sounds. No wheezing, rhonchi or rales.  Musculoskeletal:     Cervical back: Neck supple.     Right lower leg: No edema.     Left lower leg: No edema.  Lymphadenopathy:     Cervical: No cervical adenopathy.  Skin:    General: Skin is warm and dry.     Findings: No rash.  Neurological:     Mental Status: She is alert and oriented to person, place, and time. Mental status is at baseline.  Psychiatric:        Mood and Affect: Mood normal.        Behavior: Behavior normal.      No results found for any visits on 08/22/23.      Assessment & Plan Arthritis Chronic arthritis with pain exacerbated by cold weather. Previous carpal tunnel surgery ineffective. Pain management with Voltaren gel affects tactile sensation. - Continue Voltaren gel for pain management. - Advise use of gloves and application of Voltaren gel at night.  Leg pain Chronic Soreness in legs, particularly above the ankles. Uses a walker for mobility. - Advise elevation of legs when resting. 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 Advised to manage fluid intake to prevent fluid retention and maintain kidney function. - Advise careful management of fluid intake. - Monitor kidney function through regular blood work.  Heart murmur Long-standing heart murmur since childhood. No recent changes in medication regimen. Declines further cardiac evaluation.  General Health Maintenance Declines annual wellness exam, bone density exam, and further cardiac evaluation. Prefers to avoid unnecessary medical interventions.  Goals of Care Desires to live comfortably without extensive medical interventions. Prefers not to undergo further  diagnostic tests or procedures, focusing on maintaining current quality of life.  Essential hypertension Chronic and stable Continue current med regimen - Comprehensive metabolic panel with GFR - Lipid panel - lisinopril  (ZESTRIL ) 40 MG tablet; Take 1 tablet (40 mg total) by mouth daily.  Dispense: 90 tablet; Refill: 1 - pravastatin  (PRAVACHOL ) 40 MG tablet; Take 1 tablet (40 mg total) by mouth daily.  Dispense: 90 tablet; Refill: 1 Will follow-up  Hypercholesteremia (Primary) Chronic and stable Continue current med regimen - Comprehensive metabolic panel with GFR - Lipid panel - lisinopril  (ZESTRIL ) 40 MG tablet; Take 1 tablet (40 mg total) by mouth daily.  Dispense: 90 tablet; Refill: 1 - pravastatin  (PRAVACHOL ) 40 MG tablet; Take 1 tablet (  40 mg total) by mouth daily.  Dispense: 90 tablet; Refill: 1 Will follow-up  Stage 3b chronic kidney disease (HCC) Chronic and stable Ordered labs - Comprehensive metabolic panel with GFR Continue lisinopril  Will follow-up   Orders Placed This Encounter  Procedures   Comprehensive metabolic panel with GFR    Has the patient fasted?:   Yes   Lipid panel    Has the patient fasted?:   Yes    Return in about 6 months (around 02/22/2024) for chronic disease f/u.   The patient was advised to call back or seek an in-person evaluation if the symptoms worsen or if the condition fails to improve as anticipated.  I discussed the assessment and treatment plan with the patient. The patient was provided an opportunity to ask questions and all were answered. The patient agreed with the plan and demonstrated an understanding of the instructions.  I, Danean Marner, PA-C have reviewed all documentation for this visit. The documentation on 08/22/2023  for the exam, diagnosis, procedures, and orders are all accurate and complete.  Blane Bunting, [facility], MMS [facility] [phone] (phone) [phone] (fax)  [facility]

## 2023-08-23 ENCOUNTER — Ambulatory Visit: Payer: Self-pay | Admitting: Physician Assistant

## 2023-08-23 LAB — COMPREHENSIVE METABOLIC PANEL WITH GFR
ALT: 7 IU/L (ref 0–32)
AST: 13 IU/L (ref 0–40)
Albumin: 4 g/dL (ref 3.6–4.6)
Alkaline Phosphatase: 93 IU/L (ref 44–121)
BUN/Creatinine Ratio: 23 (ref 12–28)
BUN: 24 mg/dL (ref 10–36)
Bilirubin Total: 0.5 mg/dL (ref 0.0–1.2)
CO2: 18 mmol/L — ABNORMAL LOW (ref 20–29)
Calcium: 9.8 mg/dL (ref 8.7–10.3)
Chloride: 104 mmol/L (ref 96–106)
Creatinine, Ser: 1.06 mg/dL — ABNORMAL HIGH (ref 0.57–1.00)
Globulin, Total: 2.4 g/dL (ref 1.5–4.5)
Glucose: 89 mg/dL (ref 70–99)
Potassium: 4.5 mmol/L (ref 3.5–5.2)
Sodium: 139 mmol/L (ref 134–144)
Total Protein: 6.4 g/dL (ref 6.0–8.5)
eGFR: 50 mL/min/{1.73_m2} — ABNORMAL LOW (ref >59)

## 2023-08-23 LAB — LIPID PANEL
Chol/HDL Ratio: 3.3 ratio (ref 0.0–4.4)
Cholesterol, Total: 190 mg/dL (ref 100–199)
HDL: 58 mg/dL (ref >39)
LDL Chol Calc (NIH): 117 mg/dL — ABNORMAL HIGH (ref 0–99)
Triglycerides: 85 mg/dL (ref 0–149)
VLDL Cholesterol Cal: 15 mg/dL (ref 5–40)

## 2023-12-15 ENCOUNTER — Other Ambulatory Visit: Payer: Self-pay | Admitting: Physician Assistant

## 2023-12-15 DIAGNOSIS — I10 Essential (primary) hypertension: Secondary | ICD-10-CM

## 2023-12-15 DIAGNOSIS — E7800 Pure hypercholesterolemia, unspecified: Secondary | ICD-10-CM

## 2023-12-17 ENCOUNTER — Other Ambulatory Visit: Payer: Self-pay | Admitting: Physician Assistant

## 2023-12-17 DIAGNOSIS — I10 Essential (primary) hypertension: Secondary | ICD-10-CM

## 2023-12-17 DIAGNOSIS — E7800 Pure hypercholesterolemia, unspecified: Secondary | ICD-10-CM

## 2024-02-18 ENCOUNTER — Ambulatory Visit: Admitting: Family Medicine

## 2024-02-25 ENCOUNTER — Ambulatory Visit: Admitting: Family Medicine

## 2024-03-14 ENCOUNTER — Other Ambulatory Visit: Payer: Self-pay | Admitting: Physician Assistant

## 2024-03-14 DIAGNOSIS — I10 Essential (primary) hypertension: Secondary | ICD-10-CM

## 2024-03-14 DIAGNOSIS — E7800 Pure hypercholesterolemia, unspecified: Secondary | ICD-10-CM

## 2024-04-11 ENCOUNTER — Emergency Department

## 2024-04-11 ENCOUNTER — Other Ambulatory Visit: Payer: Self-pay | Admitting: *Deleted

## 2024-04-11 ENCOUNTER — Other Ambulatory Visit: Payer: Self-pay

## 2024-04-11 ENCOUNTER — Emergency Department
Admission: EM | Admit: 2024-04-11 | Discharge: 2024-04-11 | Disposition: A | Discharge status: Home / Self Care | Attending: Emergency Medicine | Admitting: Emergency Medicine

## 2024-04-11 DIAGNOSIS — Z85528 Personal history of other malignant neoplasm of kidney: Secondary | ICD-10-CM | POA: Insufficient documentation

## 2024-04-11 DIAGNOSIS — C50919 Malignant neoplasm of unspecified site of unspecified female breast: Secondary | ICD-10-CM | POA: Diagnosis not present

## 2024-04-11 DIAGNOSIS — M48061 Spinal stenosis, lumbar region without neurogenic claudication: Secondary | ICD-10-CM

## 2024-04-11 DIAGNOSIS — N6341 Unspecified lump in right breast, subareolar: Secondary | ICD-10-CM | POA: Insufficient documentation

## 2024-04-11 DIAGNOSIS — N6331 Unspecified lump in axillary tail of the right breast: Secondary | ICD-10-CM | POA: Diagnosis not present

## 2024-04-11 DIAGNOSIS — R519 Headache, unspecified: Secondary | ICD-10-CM | POA: Diagnosis not present

## 2024-04-11 DIAGNOSIS — R590 Localized enlarged lymph nodes: Secondary | ICD-10-CM | POA: Insufficient documentation

## 2024-04-11 DIAGNOSIS — N644 Mastodynia: Secondary | ICD-10-CM | POA: Diagnosis present

## 2024-04-11 DIAGNOSIS — C7951 Secondary malignant neoplasm of bone: Secondary | ICD-10-CM | POA: Diagnosis not present

## 2024-04-11 DIAGNOSIS — C50911 Malignant neoplasm of unspecified site of right female breast: Secondary | ICD-10-CM

## 2024-04-11 DIAGNOSIS — I10 Essential (primary) hypertension: Secondary | ICD-10-CM | POA: Diagnosis not present

## 2024-04-11 DIAGNOSIS — K5730 Diverticulosis of large intestine without perforation or abscess without bleeding: Secondary | ICD-10-CM | POA: Diagnosis not present

## 2024-04-11 DIAGNOSIS — R7402 Elevation of levels of lactic acid dehydrogenase (LDH): Secondary | ICD-10-CM | POA: Insufficient documentation

## 2024-04-11 DIAGNOSIS — N611 Abscess of the breast and nipple: Secondary | ICD-10-CM | POA: Insufficient documentation

## 2024-04-11 DIAGNOSIS — L0291 Cutaneous abscess, unspecified: Secondary | ICD-10-CM

## 2024-04-11 LAB — BASIC METABOLIC PANEL WITH GFR
Anion gap: 15 (ref 5–15)
BUN: 20 mg/dL (ref 8–23)
CO2: 19 mmol/L — ABNORMAL LOW (ref 22–32)
Calcium: 10.3 mg/dL (ref 8.9–10.3)
Chloride: 104 mmol/L (ref 98–111)
Creatinine, Ser: 1.02 mg/dL — ABNORMAL HIGH (ref 0.44–1.00)
GFR, Estimated: 52 mL/min — ABNORMAL LOW
Glucose, Bld: 109 mg/dL — ABNORMAL HIGH (ref 70–99)
Potassium: 4.3 mmol/L (ref 3.5–5.1)
Sodium: 138 mmol/L (ref 135–145)

## 2024-04-11 LAB — LACTIC ACID, PLASMA: Lactic Acid, Venous: 2.1 mmol/L (ref 0.5–1.9)

## 2024-04-11 LAB — CBC
HCT: 42.7 % (ref 36.0–46.0)
Hemoglobin: 14 g/dL (ref 12.0–15.0)
MCH: 30.3 pg (ref 26.0–34.0)
MCHC: 32.8 g/dL (ref 30.0–36.0)
MCV: 92.4 fL (ref 80.0–100.0)
Platelets: 168 K/uL (ref 150–400)
RBC: 4.62 MIL/uL (ref 3.87–5.11)
RDW: 15.1 % (ref 11.5–15.5)
WBC: 7.8 K/uL (ref 4.0–10.5)
nRBC: 0 % (ref 0.0–0.2)

## 2024-04-11 MED ORDER — SODIUM CHLORIDE 0.9 % IV BOLUS
500.0000 mL | Freq: Once | INTRAVENOUS | Status: AC
  Administered 2024-04-11: 500 mL via INTRAVENOUS

## 2024-04-11 MED ORDER — IOHEXOL 300 MG/ML  SOLN
100.0000 mL | Freq: Once | INTRAMUSCULAR | Status: AC | PRN
  Administered 2024-04-11: 100 mL via INTRAVENOUS

## 2024-04-11 MED ORDER — DOXYCYCLINE HYCLATE 100 MG PO TABS: 100.0000 mg | ORAL_TABLET | Freq: Two times a day (BID) | ORAL | 0 refills | Status: AC

## 2024-04-11 MED ORDER — DEXAMETHASONE 1 MG PO TABS: 1.0000 mg | ORAL_TABLET | Freq: Two times a day (BID) | ORAL | 0 refills | Status: AC

## 2024-04-11 NOTE — Discharge Instructions (Addendum)
 We have placed her on some antibiotics in case there is any overlying infection associated with this but I suspect that this time this is most likely related to metastatic breast cancer.  If she develops any fevers, worsening symptoms or any other concerns please return to the ER for repeat evaluation otherwise oncology team should call her to make an appointment.  IMPRESSION: 1. A couple of enhancing masses noted in the right breast. The largest is a centrally hypodense mass in the retroareolar region measuring 6.2x4.8 cm, consistent with a primary breast malignancy. 2. Enlarged right retropectoral lymph node measuring 1.5 cm and subcarinal lymph node measuring 1.3 cm consistent with metastatic disease. 3. Multifocal osseous metastatic disease, including a bony destructive lesion in the right pedicle of L5 causing significant mass effect and narrowing of the right neural foramen and spinal canal at this level. 4. Extensive sigmoid colonic diverticulosis. No changes of acute diverticulitis. 5. 1.1 cm posterior right lower lobe nodule (axial 130) concerning for metastatic disease in the lung.

## 2024-04-11 NOTE — ED Notes (Signed)
 Pt's son helped pt to wheelchair, son wanted to know plan of care, RN informed them MD will come to talk to them

## 2024-04-11 NOTE — ED Notes (Signed)
 Dr Viviann notified of lactic 2.1. orders to be placed as needed

## 2024-04-11 NOTE — ED Triage Notes (Signed)
 First nurse note: pt to ED [facility] for right breast lumps, redness, drainage x2 months.

## 2024-04-11 NOTE — ED Notes (Signed)
 MD at bedside.

## 2024-04-11 NOTE — ED Triage Notes (Signed)
 Pt to ED via POV from [facility]. Pt sent due to lump on right breast with redness and drainage x2 months. No fever.

## 2024-04-11 NOTE — ED Provider Notes (Signed)
 "  [facility] Provider Note    Event Date/Time   First MD Initiated Contact with Patient 04/11/24 1509     (approximate)   History   Breast Pain   HPI  Sharon Cooper is a 89 y.o. female with hypertension who comes in with 3 months of right breast swelling lumps redness.  She has never had a mammogram before.  She reports not telling any of her family members about this.  She denies any significant pain or other concerns associated with it.  No fevers.  She does report some discharge coming out.  Does have a history of renal cell carcinoma.     Physical Exam   Triage Vital Signs: ED Triage Vitals  Encounter Vitals Group     BP 04/11/24 1321 115/85     Girls Systolic BP Percentile --      Girls Diastolic BP Percentile --      Boys Systolic BP Percentile --      Boys Diastolic BP Percentile --      Pulse Rate 04/11/24 1321 76     Resp 04/11/24 1321 18     Temp 04/11/24 1321 98.4 F (36.9 C)     Temp Source 04/11/24 1321 Oral     SpO2 04/11/24 1321 98 %     Weight --      Height --      Head Circumference --      Peak Flow --      Pain Score 04/11/24 1322 0     Pain Loc --      Pain Education --      Exclude from Growth Chart --     Most recent vital signs: Vitals:   04/11/24 1321 04/11/24 1516  BP: 115/85   Pulse: 76   Resp: 18   Temp: 98.4 F (36.9 C)   SpO2: 98% 100%     General: Awake, no distress.  CV:  Good peripheral perfusion.  Resp:  Normal effort.  Abd:  No distention.  Soft nontender Other:  See media tab of the right breast.  No palpable lymph nodes in her right axilla. She has got no surrounding cellulitis up onto the skin itself.  No active drainage.  No fluctuant mass.  ED Results / Procedures / Treatments   Labs (all labs ordered are listed, but only abnormal results are displayed) Labs Reviewed  BASIC METABOLIC PANEL WITH GFR - Abnormal; Notable for the following components:      Result Value   CO2 19 (*)     Glucose, Bld 109 (*)    Creatinine, Ser 1.02 (*)    GFR, Estimated 52 (*)    All other components within normal limits  LACTIC ACID, PLASMA - Abnormal; Notable for the following components:   Lactic Acid, Venous 2.1 (*)    All other components within normal limits  CBC  CANCER ANTIGEN 27.29     RADIOLOGY I have reviewed the ct  personally and interpreted concern for positive breast cancer on the right   PROCEDURES:  Critical Care performed: No  Procedures   MEDICATIONS ORDERED IN ED: Medications  iohexol  (OMNIPAQUE ) 300 MG/ML solution 100 mL (100 mLs Intravenous Contrast Given 04/11/24 1653)  sodium chloride  0.9 % bolus 500 mL (0 mLs Intravenous Stopped 04/11/24 1906)     IMPRESSION / MDM / ASSESSMENT AND PLAN / ED COURSE  I reviewed the triage vital signs and the nursing notes.   Patient's presentation  is most consistent with acute presentation with potential threat to life or bodily function.   Patient comes in with breast swelling of the right.  This is most concerning for malignancy given its presence there for over 3 months she denies any infectious symptoms.  I discussed the case with Dr. Jacobo who did want a CA125 and a CT scan to evaluate for spread of the cancer.  He is going to follow-up in office at next week and get her scheduled for mammograms, ultrasounds.  Lactic was slightly elevated but patient denies any symptoms and this does not seem to represent an infection. SIRS criteria negative.  Her CBC is reassuring BMP shows slightly low bicarb but similar to her prior.  Will give a little bit of gentle fluids while awaiting CT imaging but I do not believe this needs to be repeated as I do not believe this is consistent with sepsis.  IMPRESSION: 1. A couple of enhancing masses noted in the right breast. The largest is a centrally hypodense mass in the retroareolar region measuring 6.2x4.8 cm, consistent with a primary breast malignancy. 2. Enlarged right  retropectoral lymph node measuring 1.5 cm and subcarinal lymph node measuring 1.3 cm consistent with metastatic disease. 3. Multifocal osseous metastatic disease, including a bony destructive lesion in the right pedicle of L5 causing significant mass effect and narrowing of the right neural foramen and spinal canal at this level. 4. Extensive sigmoid colonic diverticulosis. No changes of acute diverticulitis. 5. 1.1 cm posterior right lower lobe nodule (axial 130) concerning for metastatic disease in the lung.  Discussed with patient CT imaging.  Patient is aware that I am concerned about the possibility of metastatic breast cancer.  It I have also sent this copy of report to the oncologist team who is going to follow-up with her outpatient.  We can place her on some antibiotics in case there is any concurrent infection over the breast but it seems more likely related to the breast cancer.  Given this concern for the L5 spinal cord lesion she does have a little bit of weakness in this leg.  Sounds that this is been a chronic issue and she has not had any recent changes in her ability to ambulate but she does report a lot of issues with sciatica.  I will get MRI just to ensure no cord compression of this area although my suspicion overall is low clinically.  After this patient can be discharged home with outpatient follow-up with oncology    FINAL CLINICAL IMPRESSION(S) / ED DIAGNOSES   Final diagnoses:  Abscess  Breast abscess     Rx / DC Orders   ED Discharge Orders          Ordered    doxycycline  (VIBRA -TABS) 100 MG tablet  2 times daily        04/11/24 1921             Note:  This document was prepared using Dragon voice recognition software and may include unintentional dictation errors.   Ernest Ronal BRAVO, MD 04/11/24 1921  "

## 2024-04-11 NOTE — ED Notes (Addendum)
 Pt left exam room with son,  wanted discharge instructions and returned to room

## 2024-04-11 NOTE — ED Notes (Signed)
 Pt to MRI

## 2024-04-13 LAB — CANCER ANTIGEN 27.29: CA 27.29: 222.8 U/mL — ABNORMAL HIGH (ref 0.0–38.6)

## 2024-04-13 NOTE — ED Provider Notes (Signed)
" °  Physical Exam  BP (!) 182/108 (BP Location: Left Arm)   Pulse (!) 103   Temp 97.8 F (36.6 C) (Oral)   Resp 20   SpO2 97%   Physical Exam  Procedures  Procedures  ED Course / MDM   Clinical Course as of 04/13/24 0039  Fri Apr 11, 2024  1957 S/o from Dr. Ernest: - 66F w/ back pain - new dx breast cancer, possible cellulitis, started on abx - outpt f/u w/ Jacobo of oncology arranged - CT w/ incidental spinal mass, chronic R leg weakness, no clear new finidings - MRI pending to ensure no cord compression   TO DO: - f/u MRI --> if no acute pathology, stable for outpt f/u [MM]  2023 MRI L spine: IMPRESSION: 1. Osseous metastases involving the lumbar spine, including L2, L3, L4, L5, S1, and the right sacral ala. 2. Moderate right central canal stenosis at S1-S2 related to metastatic involvement of the right S1 posterior elements. 3. Severe central canal stenosis at L2-L3, L3-L4, and L4-L5 levels. 4. Moderate to severe central canal stenosis at L5-S1. 5. Severe bilateral foraminal stenosis at L3-L4. 6. Severe right foraminal stenosis at L5-S1.   [MM]  2136 Called [place] radiology to clarify radiology report - different radiologist Franky Lin now covering neuro rad --currently on a different call, will call me back shortly [MM]  2146 D/w dr. Lin who reviewed imaging, cauda equina is getting compressed in canal, unclear chronicity [MM]  2211 D/w Dr. Clois Multilevel spondylotic sever stenosis Data for multilevel poor Timeframe not suggestive of acute problem Can give steroids, dexamethasone  1mg  bid  Updated family who is amenable to steroids and requesting discharge home.  They will follow-up with oncology shortly.  ED return precautions in place. [MM]    Clinical Course User Index [MM] Clarine Ozell LABOR, MD   Medical Decision Making Amount and/or Complexity of Data Reviewed Labs: ordered. Radiology: ordered.  Risk Prescription drug  management.          Clarine Ozell LABOR, MD 04/13/24 [phone]  "

## 2024-04-14 ENCOUNTER — Encounter: Payer: Self-pay | Admitting: *Deleted

## 2024-04-14 DIAGNOSIS — N644 Mastodynia: Secondary | ICD-10-CM

## 2024-04-14 NOTE — Progress Notes (Signed)
 New referral for possible breast cancer received from ED.   Dr. Jacobo would like to see her this week along with palliative care.   She is scheduled for 1/21 at 1:00.  Mammogram order has been placed.

## 2024-04-15 ENCOUNTER — Other Ambulatory Visit: Payer: Self-pay | Admitting: Oncology

## 2024-04-15 DIAGNOSIS — R928 Other abnormal and inconclusive findings on diagnostic imaging of breast: Secondary | ICD-10-CM

## 2024-04-16 ENCOUNTER — Encounter: Payer: Self-pay | Admitting: Oncology

## 2024-04-16 ENCOUNTER — Inpatient Hospital Stay: Attending: Oncology | Admitting: Oncology

## 2024-04-16 ENCOUNTER — Inpatient Hospital Stay

## 2024-04-16 ENCOUNTER — Inpatient Hospital Stay: Admitting: Hospice and Palliative Medicine

## 2024-04-16 ENCOUNTER — Encounter: Payer: Self-pay | Admitting: *Deleted

## 2024-04-16 ENCOUNTER — Other Ambulatory Visit: Payer: Self-pay | Admitting: *Deleted

## 2024-04-16 VITALS — BP 97/76 | HR 73 | Temp 96.5°F | Resp 18 | Ht 64.0 in | Wt 161.0 lb

## 2024-04-16 DIAGNOSIS — Z515 Encounter for palliative care: Secondary | ICD-10-CM

## 2024-04-16 DIAGNOSIS — Z905 Acquired absence of kidney: Secondary | ICD-10-CM | POA: Diagnosis not present

## 2024-04-16 DIAGNOSIS — C801 Malignant (primary) neoplasm, unspecified: Secondary | ICD-10-CM | POA: Diagnosis not present

## 2024-04-16 DIAGNOSIS — C7951 Secondary malignant neoplasm of bone: Secondary | ICD-10-CM | POA: Insufficient documentation

## 2024-04-16 DIAGNOSIS — N63 Unspecified lump in unspecified breast: Secondary | ICD-10-CM | POA: Diagnosis not present

## 2024-04-16 DIAGNOSIS — G893 Neoplasm related pain (acute) (chronic): Secondary | ICD-10-CM | POA: Diagnosis not present

## 2024-04-16 DIAGNOSIS — N6310 Unspecified lump in the right breast, unspecified quadrant: Secondary | ICD-10-CM | POA: Diagnosis not present

## 2024-04-16 DIAGNOSIS — I2693 Single subsegmental pulmonary embolism without acute cor pulmonale: Secondary | ICD-10-CM | POA: Insufficient documentation

## 2024-04-16 MED ORDER — TRAMADOL HCL 50 MG PO TABS: 50.0000 mg | ORAL_TABLET | Freq: Four times a day (QID) | ORAL | 0 refills | Status: DC | PRN

## 2024-04-16 NOTE — Progress Notes (Signed)
 Accompanied patient and family to initial medical oncology appointment.   Reviewed Breast Cancer treatment handbook.   Care plan summary given to patient.   Reviewed outreach programs and cancer center services.

## 2024-04-16 NOTE — Progress Notes (Signed)
 "    Palliative Medicine [facility] at [facility] [phone] [phone]   Name: Sharon Cooper Date: 04/16/2024 MRN: [id number]  DOB: 1930/10/25  Patient Care Team: Sharma Coyer, MD as PCP - General (Family Medicine) Pa, [facility] (Optometry) Hooten, Lynwood SQUIBB, MD (Orthopedic Surgery) Georgina Shasta POUR, RN as Oncology Nurse Navigator    REASON FOR CONSULTATION: Sharon Cooper is a 89 y.o. female with multiple medical problems including right breast mass and bone metastasis.  Palliative care consulted to address goals.  SOCIAL HISTORY:     reports that she has never smoked. She has never used smokeless tobacco. She reports that she does not drink alcohol and does not use drugs.  Patient widowed.  Lives at home alone but now has hired caregivers.  Has a daughter and son who live nearby.  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  ADVANCE DIRECTIVES:  Not on file  CODE STATUS: DNR/DNI (DNR order signed on 04/16/2024)  PAST MEDICAL HISTORY: Past Medical History:  Diagnosis Date   Cancer (HCC)    Right kidney removed   Hyperlipidemia    Hypertension     PAST SURGICAL HISTORY:  Past Surgical History:  Procedure Laterality Date   CARPAL TUNNEL RELEASE     KNEE SURGERY Right    NEPHRECTOMY Right    metastatic cancer   NEPHRECTOMY      HEMATOLOGY/ONCOLOGY HISTORY:  Oncology History   No problem history exists.    ALLERGIES:  is allergic to lactose intolerance (gi) and milk protein.  MEDICATIONS:  Current Outpatient Medications  Medication Sig Dispense Refill   acetaminophen  (TYLENOL ) 325 MG tablet Take 2 tablets (650 mg total) by mouth every 6 (six) hours as needed. (Patient not taking: Reported on 04/11/2024)     Ascorbic Acid (VITAMIN C) 100 MG tablet Take 100 mg by mouth daily.     aspirin EC 81 MG tablet Take 81 mg by mouth daily.     Cholecalciferol (VITAMIN D-3) 1000 UNITS CAPS Take 1 capsule by mouth daily.      cyanocobalamin 100 MCG tablet Take 1 tablet by mouth daily.     dexamethasone  (DECADRON ) 1 MG tablet Take 1 tablet (1 mg total) by mouth 2 (two) times daily with a meal for 5 days. 10 tablet 0   doxycycline  (VIBRA -TABS) 100 MG tablet Take 1 tablet (100 mg total) by mouth 2 (two) times daily for 7 days. 14 tablet 0   lisinopril  (ZESTRIL ) 40 MG tablet TAKE 1 TABLET BY MOUTH ONCE DAILY 30 tablet 0   metoprolol  succinate (TOPROL -XL) 25 MG 24 hr tablet Take 0.5 tablets (12.5 mg total) by mouth daily. 45 tablet 1   Omega-3 Fatty Acids (FISH OIL BURP-LESS) 1000 MG CAPS Take 1 tablet by mouth daily.     pravastatin  (PRAVACHOL ) 40 MG tablet Take 1 tablet (40 mg total) by mouth daily. 90 tablet 1   No current facility-administered medications for this visit.    VITAL SIGNS: There were no vitals taken for this visit. There were no vitals filed for this visit.  Estimated body mass index is 27.64 kg/m as calculated from the following:   Height as of an earlier encounter on 04/16/24: 5' 4 (1.626 m).   Weight as of an earlier encounter on 04/16/24: 161 lb (73 kg).  LABS: CBC:    Component Value Date/Time   WBC 7.8 04/11/2024 1324   HGB 14.0 04/11/2024 1324   HGB 14.0 04/08/2020 1453  HCT 42.7 04/11/2024 1324   HCT 41.3 04/08/2020 1453   PLT 168 04/11/2024 1324   PLT 199 04/08/2020 1453   MCV 92.4 04/11/2024 1324   MCV 90 04/08/2020 1453   MCV 93 08/14/2011 1316   NEUTROABS 5.6 04/08/2020 1453   LYMPHSABS 1.1 04/08/2020 1453   EOSABS 0.1 04/08/2020 1453   BASOSABS 0.0 04/08/2020 1453   Comprehensive Metabolic Panel:    Component Value Date/Time   NA 138 04/11/2024 1324   NA 139 08/22/2023 1417   NA 136 08/30/2011 0623   K 4.3 04/11/2024 1324   K 3.8 08/30/2011 0623   CL 104 04/11/2024 1324   CL 104 08/30/2011 0623   CO2 19 (L) 04/11/2024 1324   CO2 23 08/30/2011 0623   BUN 20 04/11/2024 1324   BUN 24 08/22/2023 1417   BUN 11 08/30/2011 0623   CREATININE 1.02 (H) 04/11/2024  1324   CREATININE 0.95 08/30/2011 0623   GLUCOSE 109 (H) 04/11/2024 1324   GLUCOSE 134 (H) 08/30/2011 0623   CALCIUM  10.3 04/11/2024 1324   CALCIUM  8.5 08/30/2011 0623   AST 13 08/22/2023 1417   ALT 7 08/22/2023 1417   ALKPHOS 93 08/22/2023 1417   BILITOT 0.5 08/22/2023 1417   PROT 6.4 08/22/2023 1417   ALBUMIN 4.0 08/22/2023 1417    RADIOGRAPHIC STUDIES: MR LUMBAR SPINE WO CONTRAST Result Date: 04/11/2024 EXAM: MRI LUMBAR SPINE 04/11/2024 07:31:24 PM TECHNIQUE: Multiplanar multisequence MRI of the lumbar spine was performed without the administration of intravenous contrast. COMPARISON: None available. CLINICAL HISTORY: Bone mass or bone pain, lumbar spine, aggressive features on xray. Abnormal CT scan. Osseous metastases. Known breast cancer primary. FINDINGS: BONES AND ALIGNMENT: 2 mm retrolisthesis is present at L1-L2 and L2-L3. Grade 1 anterolisthesis at L3-L4 measures 5 mm. Grade 1 anterolisthesis at L4-L5 measures 8 mm. Normal vertebral body heights. A 1.5 cm sclerotic lesion is present along the anterior inferior aspect of L2, appearing contiguous with the L3 lesion. Bone marrow signal demonstrates a sclerotic lesion filling the majority of the L3 vertebral body. A lesion is present within the right pedicle of L4 ascending into the posterior elements with expansion of the right lamina. Diffuse metastatic infiltration is present at L5 with extension through both pedicles and involvement of the posterior elements, right greater than left. A 2 cm metastasis is present in the right sacral ala. A 2 cm mass in the right lamina of S1 contributes to moderate right central canal stenosis at S1-S2. SPINAL CORD: The conus medullaris terminates at T12-L1. SOFT TISSUES: No paraspinal mass. L1-L2: Rightward disc protrusion and moderate right foraminal stenosis. L2-L3: A broad based disc protrusion and advanced facet hypertrophy result in severe central canal stenosis and moderate bilateral foraminal  narrowing. L3-L4: Uncovering of a broad based disc protrusion and advanced facet hypertrophy result in severe central and bilateral foraminal stenosis. L4-L5: Broad based disc protrusion and advanced facet hypertrophy result in severe central and moderate bilateral foraminal stenosis. L5-S1: A broad based disc protrusion contributes to moderate to severe central canal stenosis. Severe right and moderate left foraminal stenosis is present. IMPRESSION: 1. Osseous metastases involving the lumbar spine, including L2, L3, L4, L5, S1, and the right sacral ala. 2. Moderate right central canal stenosis at S1-S2 related to metastatic involvement of the right S1 posterior elements. 3. Severe central canal stenosis at L2-L3, L3-L4, and L4-L5 levels. 4. Moderate to severe central canal stenosis at L5-S1. 5. Severe bilateral foraminal stenosis at L3-L4. 6. Severe right foraminal stenosis  at L5-S1. Electronically signed by: Lonni Necessary MD 04/11/2024 08:03 PM EST RP Workstation: [id number]   CT CHEST ABDOMEN PELVIS W CONTRAST Result Date: 04/11/2024 EXAM: CT CHEST, ABDOMEN AND PELVIS WITH CONTRAST 04/11/2024 05:10:44 PM TECHNIQUE: CT of the chest, abdomen and pelvis was performed with the administration of 100 mL of iohexol  (OMNIPAQUE ) 300 MG/ML solution. Multiplanar reformatted images are provided for review. Automated exposure control, iterative reconstruction, and/or weight based adjustment of the mA/kV was utilized to reduce the radiation dose to as low as reasonably achievable. COMPARISON: 10/05/2012 CLINICAL HISTORY: Lymphadenopathy, groin. FINDINGS: CHEST: MEDIASTINUM AND LYMPH NODES: Heart and pericardium are unremarkable. Posterior coronary atherosclerosis. Moderate sized hiatal hernia. Subcarinal lymph node measuring 1.3 cm. Enlarged right retropectoral lymph node measuring 1.5 cm. The central airways are clear. No mediastinal, hilar or axillary lymphadenopathy. LUNGS AND PLEURA: Calcifications are seen in the  posterior left and right sides of the right lung bases as well as in the posterior left and right lung bases. Mosaic attenuation throughout the lungs, possibly reflecting chronic small airways disease. Posterior right lower lobe lung nodule measuring 1.1 cm (axial 130). Subsegmental atelectasis or scarring along the anterolateral left lower lobe. No focal consolidation or pulmonary edema. No pleural effusion. No pneumothorax. ABDOMEN AND PELVIS: LIVER: Unremarkable. GALLBLADDER AND BILE DUCTS: Unremarkable. No biliary ductal dilatation. SPLEEN: No acute abnormality. PANCREAS: 2.5 cm cystic structure in the uncinate process of the pancreas, possibly a pseudocyst or side branch intraductal papillary mucinous neoplasm. ADRENAL GLANDS: No acute abnormality. KIDNEYS, URETERS AND BLADDER: Multiple left sided parapelvic renal cysts. Right nephrectomy. No stones in the kidneys or ureters. No hydronephrosis. No perinephric or periureteral stranding. The urinary bladder is distended without focal abnormality. GI AND BOWEL: Stomach demonstrates no acute abnormality. Extensive sigmoid colonic diverticulosis. No changes of acute diverticulitis. Decompressed normal appendix, best visualized on the coronal images. There is no bowel obstruction. REPRODUCTIVE ORGANS: Age related atrophy of the uterus and ovaries. PERITONEUM AND RETROPERITONEUM: No ascites. No free air. No free pelvic fluid. VASCULATURE: Aorta is normal in caliber. Diffuse aortoiliac atherosclerosis. ABDOMINAL AND PELVIS LYMPH NODES: No lymphadenopathy. BONES AND SOFT TISSUES: Skin thickening of the lateral right breast with a large centrally hypodense breast mass measuring 6.2 x 4.8 cm. There is a separate breast mass in the axillary tail region measuring 3.7 x 4.1 cm. Severe degenerative changes of both glenohumeral joints with large joint effusions, more so on the right than the left. Diffuse osteopenia. Advanced multilevel degenerative disc disease worst in the  lumbar spine. Mild grade 1 anterolisthesis of L3 on L4 with retrolisthesis of L1 on L2 and L2 on L3. Bony sclerosis in the T6 and T7 vertebral bodies, worrisome for bony metastatic disease. There is also a bony sclerotic lesion in the S2 vertebral body, likely metastatic. 2.6 cm sclerotic lesion in the right iliac wing is also likely metastatic. Bony destructive lesion in the right pedicle of L5 causing significant mass effect and narrowing of the right neural foramen and spinal canal at this level (axial 87). Healing left lateral rib fracture of the 6th rib. Severe right hip osteoarthritis. No acute osseous abnormality. No focal soft tissue abnormality. IMPRESSION: 1. A couple of enhancing masses noted in the right breast. The largest is a centrally hypodense mass in the retroareolar region measuring 6.2x4.8 cm, consistent with a primary breast malignancy. 2. Enlarged right retropectoral lymph node measuring 1.5 cm and subcarinal lymph node measuring 1.3 cm consistent with metastatic disease. 3. Multifocal osseous metastatic disease, including a  bony destructive lesion in the right pedicle of L5 causing significant mass effect and narrowing of the right neural foramen and spinal canal at this level. 4. Extensive sigmoid colonic diverticulosis. No changes of acute diverticulitis. 5. 1.1 cm posterior right lower lobe nodule (axial 130) concerning for metastatic disease in the lung. Electronically signed by: Rogelia Myers MD 04/11/2024 06:18 PM EST RP Workstation: [id number]   CT HEAD WO CONTRAST ( ) Result Date: 04/11/2024 EXAM: CT HEAD WITHOUT CONTRAST 04/11/2024 05:10:44 PM TECHNIQUE: CT of the head was performed without the administration of intravenous contrast. Automated exposure control, iterative reconstruction, and/or weight based adjustment of the mA/kV was utilized to reduce the radiation dose to as low as reasonably achievable. COMPARISON: Head CT without contrast 10/05/2012. CLINICAL HISTORY:  Headache, new onset (Age >= 51y). FINDINGS: BRAIN AND VENTRICLES: Progressive atrophy and white matter changes are present bilaterally. Moderate calcific atheromatous disease within carotid siphons and vertebral arteries. No acute hemorrhage. No evidence of acute infarct. No hydrocephalus. No extra-axial collection. No mass effect or midline shift. ORBITS: No acute abnormality. SINUSES: No acute abnormality. SOFT TISSUES AND SKULL: Hyperostosis frontalis interna. Degenerative changes at atlantooccipital joints. No acute soft tissue abnormality. No skull fracture. IMPRESSION: 1. No acute intracranial abnormality. Electronically signed by: Lonni Necessary MD 04/11/2024 05:34 PM EST RP Workstation: [id number]    PERFORMANCE STATUS (ECOG) : 2 - Symptomatic, <50% confined to bed  Review of Systems Unless otherwise noted, a complete review of systems is negative.  Physical Exam General: NAD Pulmonary: Labored Extremities: no edema, no joint deformities Skin: no rashes Neurological: Weakness but otherwise nonfocal  IMPRESSION: Patient seen recently in the emergency department for breast pain and reportedly had several months of right breast swelling and redness.  CT of the chest abdomen and pelvis showed several enhancing masses noted in the right breast, enlarged retropectoral lymph nodes, and multifocal osseous metastatic disease including a bony destructive lesion in L5 causing mass effect and narrowing of the right neural foramen.  CA 27.29 elevated.  Patient with presumed metastatic breast cancer.  She is agreed to biopsy and plan for treatment with possible AI if hormone receptor positive.  Otherwise, patient is not felt to be a candidate for systemic chemotherapy.  At baseline, patient lives at home alone.  Family have recently hired caregivers with use of a long-term care insurance policy.  Both son and daughter live nearby.  Symptomatically, patient complains of low back pain.  She does  have multiple osseous metastases throughout her lumbar spine.  She has been referred to radiation oncology for consideration of XRT.  Dr. Georgina again started patient on tramadol  today.  Patient has advanced directive but they are not on file.  Patient states clearly that she would not wish to be resuscitated nor have her life prolonged artificially machines.  Both patient and son agreed with DNR/DNI.  PLAN: -Continue current scope of treatment - Agree with tramadol  as needed - Bowel regimen to prevent opioid-induced constipation - Consider XRT to lumbar spine - DNR/DNI - Follow-up telephone visit 1 month  Case and plan discussed with Dr. Jacobo  Patient expressed understanding and was in agreement with this plan. She also understands that She can call the clinic at any time with any questions, concerns, or complaints.     Time Total: 20 minutes  Visit consisted of counseling and education dealing with the complex and emotionally intense issues of symptom management and palliative care in the setting of serious and potentially life-threatening illness.Greater than  50%  of this time was spent counseling and coordinating care related to the above assessment and plan.  Signed by: Fonda Mower, PhD, NP-C   "

## 2024-04-16 NOTE — Progress Notes (Signed)
 Patient is here with her son, they have Home Health Care coming out tomorrow morning. She is having pain pretty much every were. She has a bed sore, and the sore that is on her right breast, the son said the sore is really bad, swollen, red, and has some pus. She has little to no appetite. I am giving her some ensure clear drinks.

## 2024-04-16 NOTE — Progress Notes (Signed)
 " [facility]  Telephone:([phone] Fax:([phone]) [phone]  ID: Sharon Cooper OB: [birth year]  MR#: [id number]  RDW#:[id number]  Patient Care Team: Sharma Coyer, MD as PCP - General (Family Medicine) Pa, [facility] (Optometry) Hooten, Lynwood SQUIBB, MD (Orthopedic Surgery) Georgina Shasta POUR, RN as Oncology Nurse Navigator  CHIEF COMPLAINT: Likely stage IV breast cancer.  INTERVAL HISTORY: Patient is a 89 year old female who recently was evaluated in the emergency room with multiple lesions on her right breast consistent with progressive breast cancer.  Patient stated she noticed this lesion several months before, but did not mention to anybody in her family.  She has increased back pain, but otherwise feels well.  She has no neurologic complaints.  She denies any recent fevers.  She has a fair appetite, but denies weight loss.  She has no chest pain, shortness of breath, cough, or hemoptysis.  She denies any nausea, vomiting, constipation, or diarrhea.  She has no urinary complaints.  Patient offers no further specific complaints today.  REVIEW OF SYSTEMS:   Review of Systems  Constitutional: Negative.  Negative for fever, malaise/fatigue and weight loss.  Respiratory: Negative.  Negative for cough, hemoptysis and shortness of breath.   Cardiovascular: Negative.  Negative for chest pain and leg swelling.  Gastrointestinal: Negative.  Negative for abdominal pain.  Genitourinary: Negative.  Negative for dysuria.  Musculoskeletal:  Positive for back pain.  Skin: Negative.  Negative for rash.  Neurological: Negative.  Negative for dizziness, focal weakness, weakness and headaches.  Psychiatric/Behavioral: Negative.  The patient is not nervous/anxious.     As per HPI. Otherwise, a complete review of systems is negative.  PAST MEDICAL HISTORY: Past Medical History:  Diagnosis Date   Cancer (HCC)    Right kidney removed   Hyperlipidemia    Hypertension      PAST SURGICAL HISTORY: Past Surgical History:  Procedure Laterality Date   CARPAL TUNNEL RELEASE     KNEE SURGERY Right    NEPHRECTOMY Right    metastatic cancer   NEPHRECTOMY      FAMILY HISTORY: Family History  Problem Relation Age of Onset   Diabetes Son     ADVANCED DIRECTIVES (Y/N):  N  HEALTH MAINTENANCE: Social History[1]   Colonoscopy:  PAP:  Bone density:  Lipid panel:  Allergies[2]  Current Outpatient Medications  Medication Sig Dispense Refill   Ascorbic Acid (VITAMIN C) 100 MG tablet Take 100 mg by mouth daily.     aspirin EC 81 MG tablet Take 81 mg by mouth daily.     Cholecalciferol (VITAMIN D-3) 1000 UNITS CAPS Take 1 capsule by mouth daily.     cyanocobalamin 100 MCG tablet Take 1 tablet by mouth daily.     dexamethasone  (DECADRON ) 1 MG tablet Take 1 tablet (1 mg total) by mouth 2 (two) times daily with a meal for 5 days. 10 tablet 0   doxycycline  (VIBRA -TABS) 100 MG tablet Take 1 tablet (100 mg total) by mouth 2 (two) times daily for 7 days. 14 tablet 0   lisinopril  (ZESTRIL ) 40 MG tablet TAKE 1 TABLET BY MOUTH ONCE DAILY 30 tablet 0   metoprolol  succinate (TOPROL -XL) 25 MG 24 hr tablet Take 0.5 tablets (12.5 mg total) by mouth daily. 45 tablet 1   Omega-3 Fatty Acids (FISH OIL BURP-LESS) 1000 MG CAPS Take 1 tablet by mouth daily.     pravastatin  (PRAVACHOL ) 40 MG tablet Take 1 tablet (40 mg total) by mouth daily. 90 tablet 1  acetaminophen  (TYLENOL ) 325 MG tablet Take 2 tablets (650 mg total) by mouth every 6 (six) hours as needed. (Patient not taking: Reported on 04/11/2024)     No current facility-administered medications for this visit.    OBJECTIVE: Vitals:   04/16/24 1319  BP: 97/76  Pulse: 73  Resp: 18  Temp: (!) 96.5 F (35.8 C)  SpO2: 99%     Body mass index is 27.64 kg/m.    ECOG FS:2 - Symptomatic, <50% confined to bed  General: Well-developed, well-nourished, no acute distress. Eyes: Pink conjunctiva, anicteric  sclera. HEENT: Normocephalic, moist mucous membranes. Lungs: No audible wheezing or coughing. Heart: Regular rate and rhythm. Abdomen: Soft, nontender, no obvious distention. Musculoskeletal: No edema, cyanosis, or clubbing. Neuro: Alert, answering all questions appropriately. Cranial nerves grossly intact. Skin: No rashes or petechiae noted. Psych: Normal affect. Lymphatics: No cervical, calvicular, axillary or inguinal LAD.   LAB RESULTS:  Lab Results  Component Value Date   NA 138 04/11/2024   K 4.3 04/11/2024   CL 104 04/11/2024   CO2 19 (L) 04/11/2024   GLUCOSE 109 (H) 04/11/2024   BUN 20 04/11/2024   CREATININE 1.02 (H) 04/11/2024   CALCIUM  10.3 04/11/2024   PROT 6.4 08/22/2023   ALBUMIN 4.0 08/22/2023   AST 13 08/22/2023   ALT 7 08/22/2023   ALKPHOS 93 08/22/2023   BILITOT 0.5 08/22/2023   GFRNONAA 52 (L) 04/11/2024   GFRAA 46 (L) 04/08/2020    Lab Results  Component Value Date   WBC 7.8 04/11/2024   NEUTROABS 5.6 04/08/2020   HGB 14.0 04/11/2024   HCT 42.7 04/11/2024   MCV 92.4 04/11/2024   PLT 168 04/11/2024     STUDIES: MR LUMBAR SPINE WO CONTRAST Result Date: 04/11/2024 EXAM: MRI LUMBAR SPINE 04/11/2024 07:31:24 PM TECHNIQUE: Multiplanar multisequence MRI of the lumbar spine was performed without the administration of intravenous contrast. COMPARISON: None available. CLINICAL HISTORY: Bone mass or bone pain, lumbar spine, aggressive features on xray. Abnormal CT scan. Osseous metastases. Known breast cancer primary. FINDINGS: BONES AND ALIGNMENT: 2 mm retrolisthesis is present at L1-L2 and L2-L3. Grade 1 anterolisthesis at L3-L4 measures 5 mm. Grade 1 anterolisthesis at L4-L5 measures 8 mm. Normal vertebral body heights. A 1.5 cm sclerotic lesion is present along the anterior inferior aspect of L2, appearing contiguous with the L3 lesion. Bone marrow signal demonstrates a sclerotic lesion filling the majority of the L3 vertebral body. A lesion is present  within the right pedicle of L4 ascending into the posterior elements with expansion of the right lamina. Diffuse metastatic infiltration is present at L5 with extension through both pedicles and involvement of the posterior elements, right greater than left. A 2 cm metastasis is present in the right sacral ala. A 2 cm mass in the right lamina of S1 contributes to moderate right central canal stenosis at S1-S2. SPINAL CORD: The conus medullaris terminates at T12-L1. SOFT TISSUES: No paraspinal mass. L1-L2: Rightward disc protrusion and moderate right foraminal stenosis. L2-L3: A broad based disc protrusion and advanced facet hypertrophy result in severe central canal stenosis and moderate bilateral foraminal narrowing. L3-L4: Uncovering of a broad based disc protrusion and advanced facet hypertrophy result in severe central and bilateral foraminal stenosis. L4-L5: Broad based disc protrusion and advanced facet hypertrophy result in severe central and moderate bilateral foraminal stenosis. L5-S1: A broad based disc protrusion contributes to moderate to severe central canal stenosis. Severe right and moderate left foraminal stenosis is present. IMPRESSION: 1. Osseous metastases involving  the lumbar spine, including L2, L3, L4, L5, S1, and the right sacral ala. 2. Moderate right central canal stenosis at S1-S2 related to metastatic involvement of the right S1 posterior elements. 3. Severe central canal stenosis at L2-L3, L3-L4, and L4-L5 levels. 4. Moderate to severe central canal stenosis at L5-S1. 5. Severe bilateral foraminal stenosis at L3-L4. 6. Severe right foraminal stenosis at L5-S1. Electronically signed by: Lonni Necessary MD 04/11/2024 08:03 PM EST RP Workstation: [id number]   CT CHEST ABDOMEN PELVIS W CONTRAST Result Date: 04/11/2024 EXAM: CT CHEST, ABDOMEN AND PELVIS WITH CONTRAST 04/11/2024 05:10:44 PM TECHNIQUE: CT of the chest, abdomen and pelvis was performed with the administration of 100 mL of  iohexol  (OMNIPAQUE ) 300 MG/ML solution. Multiplanar reformatted images are provided for review. Automated exposure control, iterative reconstruction, and/or weight based adjustment of the mA/kV was utilized to reduce the radiation dose to as low as reasonably achievable. COMPARISON: 10/05/2012 CLINICAL HISTORY: Lymphadenopathy, groin. FINDINGS: CHEST: MEDIASTINUM AND LYMPH NODES: Heart and pericardium are unremarkable. Posterior coronary atherosclerosis. Moderate sized hiatal hernia. Subcarinal lymph node measuring 1.3 cm. Enlarged right retropectoral lymph node measuring 1.5 cm. The central airways are clear. No mediastinal, hilar or axillary lymphadenopathy. LUNGS AND PLEURA: Calcifications are seen in the posterior left and right sides of the right lung bases as well as in the posterior left and right lung bases. Mosaic attenuation throughout the lungs, possibly reflecting chronic small airways disease. Posterior right lower lobe lung nodule measuring 1.1 cm (axial 130). Subsegmental atelectasis or scarring along the anterolateral left lower lobe. No focal consolidation or pulmonary edema. No pleural effusion. No pneumothorax. ABDOMEN AND PELVIS: LIVER: Unremarkable. GALLBLADDER AND BILE DUCTS: Unremarkable. No biliary ductal dilatation. SPLEEN: No acute abnormality. PANCREAS: 2.5 cm cystic structure in the uncinate process of the pancreas, possibly a pseudocyst or side branch intraductal papillary mucinous neoplasm. ADRENAL GLANDS: No acute abnormality. KIDNEYS, URETERS AND BLADDER: Multiple left sided parapelvic renal cysts. Right nephrectomy. No stones in the kidneys or ureters. No hydronephrosis. No perinephric or periureteral stranding. The urinary bladder is distended without focal abnormality. GI AND BOWEL: Stomach demonstrates no acute abnormality. Extensive sigmoid colonic diverticulosis. No changes of acute diverticulitis. Decompressed normal appendix, best visualized on the coronal images. There is no  bowel obstruction. REPRODUCTIVE ORGANS: Age related atrophy of the uterus and ovaries. PERITONEUM AND RETROPERITONEUM: No ascites. No free air. No free pelvic fluid. VASCULATURE: Aorta is normal in caliber. Diffuse aortoiliac atherosclerosis. ABDOMINAL AND PELVIS LYMPH NODES: No lymphadenopathy. BONES AND SOFT TISSUES: Skin thickening of the lateral right breast with a large centrally hypodense breast mass measuring 6.2 x 4.8 cm. There is a separate breast mass in the axillary tail region measuring 3.7 x 4.1 cm. Severe degenerative changes of both glenohumeral joints with large joint effusions, more so on the right than the left. Diffuse osteopenia. Advanced multilevel degenerative disc disease worst in the lumbar spine. Mild grade 1 anterolisthesis of L3 on L4 with retrolisthesis of L1 on L2 and L2 on L3. Bony sclerosis in the T6 and T7 vertebral bodies, worrisome for bony metastatic disease. There is also a bony sclerotic lesion in the S2 vertebral body, likely metastatic. 2.6 cm sclerotic lesion in the right iliac wing is also likely metastatic. Bony destructive lesion in the right pedicle of L5 causing significant mass effect and narrowing of the right neural foramen and spinal canal at this level (axial 87). Healing left lateral rib fracture of the 6th rib. Severe right hip osteoarthritis. No acute osseous abnormality.  No focal soft tissue abnormality. IMPRESSION: 1. A couple of enhancing masses noted in the right breast. The largest is a centrally hypodense mass in the retroareolar region measuring 6.2x4.8 cm, consistent with a primary breast malignancy. 2. Enlarged right retropectoral lymph node measuring 1.5 cm and subcarinal lymph node measuring 1.3 cm consistent with metastatic disease. 3. Multifocal osseous metastatic disease, including a bony destructive lesion in the right pedicle of L5 causing significant mass effect and narrowing of the right neural foramen and spinal canal at this level. 4. Extensive  sigmoid colonic diverticulosis. No changes of acute diverticulitis. 5. 1.1 cm posterior right lower lobe nodule (axial 130) concerning for metastatic disease in the lung. Electronically signed by: Rogelia Myers MD 04/11/2024 06:18 PM EST RP Workstation: [id number]   CT HEAD WO CONTRAST ( ) Result Date: 04/11/2024 EXAM: CT HEAD WITHOUT CONTRAST 04/11/2024 05:10:44 PM TECHNIQUE: CT of the head was performed without the administration of intravenous contrast. Automated exposure control, iterative reconstruction, and/or weight based adjustment of the mA/kV was utilized to reduce the radiation dose to as low as reasonably achievable. COMPARISON: Head CT without contrast 10/05/2012. CLINICAL HISTORY: Headache, new onset (Age >= 51y). FINDINGS: BRAIN AND VENTRICLES: Progressive atrophy and white matter changes are present bilaterally. Moderate calcific atheromatous disease within carotid siphons and vertebral arteries. No acute hemorrhage. No evidence of acute infarct. No hydrocephalus. No extra-axial collection. No mass effect or midline shift. ORBITS: No acute abnormality. SINUSES: No acute abnormality. SOFT TISSUES AND SKULL: Hyperostosis frontalis interna. Degenerative changes at atlantooccipital joints. No acute soft tissue abnormality. No skull fracture. IMPRESSION: 1. No acute intracranial abnormality. Electronically signed by: Lonni Necessary MD 04/11/2024 05:34 PM EST RP Workstation: [id number]    ASSESSMENT: Likely stage IV breast cancer.  PLAN:    Likely stage IV breast cancer: See picture of breast under media tab.  CT scan results from April 11, 2024 also suggest widespread bony disease.  The patient will have mammogram biopsy on Friday to confirm diagnosis and to assess whether malignancy is ER/PR positive.  We discussed treatment options today including letrozole only if ER/PR positive.  If ER/PR negative, patient likely does not have treatment options.  Can also consider Xgeva for her  bony disease.  Patient will also be given a referral to radiation oncology for consideration of palliative treatment for her pain.  Return to clinic 1 week after biopsy to discuss the results and additional treatment planning. Pain: MRI results from April 11, 2024 reviewed independently and reported as above with significant bony involvement of patient's vertebral column.  She was given a prescription for tramadol  today and a referral to radiation oncology as above.  I spent a total of 60 minutes reviewing chart data, face-to-face evaluation with the patient, counseling and coordination of care as detailed above.   Patient expressed understanding and was in agreement with this plan. She also understands that She can call clinic at any time with any questions, concerns, or complaints.    Cancer Staging  No matching staging information was found for the patient.   Evalene JINNY Reusing, MD   04/16/2024 2:25 PM         [1]  Social History Tobacco Use   Smoking status: Never   Smokeless tobacco: Never  Vaping Use   Vaping status: Never Used  Substance Use Topics   Alcohol use: No   Drug use: No  [2]  Allergies Allergen Reactions   Lactose Intolerance (Gi)    Milk Protein Diarrhea   "

## 2024-04-18 ENCOUNTER — Inpatient Hospital Stay: Admitting: Nurse Practitioner

## 2024-04-18 ENCOUNTER — Ambulatory Visit
Admission: RE | Admit: 2024-04-18 | Discharge: 2024-04-18 | Disposition: A | Source: Ambulatory Visit | Attending: Oncology | Admitting: Oncology

## 2024-04-18 ENCOUNTER — Encounter: Payer: Self-pay | Admitting: *Deleted

## 2024-04-18 ENCOUNTER — Encounter: Payer: Self-pay | Admitting: Nurse Practitioner

## 2024-04-18 VITALS — BP 118/72 | HR 85 | Temp 97.5°F | Resp 16 | Wt 161.0 lb

## 2024-04-18 DIAGNOSIS — N611 Abscess of the breast and nipple: Secondary | ICD-10-CM

## 2024-04-18 DIAGNOSIS — R928 Other abnormal and inconclusive findings on diagnostic imaging of breast: Secondary | ICD-10-CM

## 2024-04-18 DIAGNOSIS — C50811 Malignant neoplasm of overlapping sites of right female breast: Secondary | ICD-10-CM | POA: Diagnosis present

## 2024-04-18 DIAGNOSIS — N644 Mastodynia: Secondary | ICD-10-CM | POA: Diagnosis present

## 2024-04-18 DIAGNOSIS — N6310 Unspecified lump in the right breast, unspecified quadrant: Secondary | ICD-10-CM | POA: Diagnosis not present

## 2024-04-18 DIAGNOSIS — I2694 Multiple subsegmental pulmonary emboli without acute cor pulmonale: Secondary | ICD-10-CM

## 2024-04-18 MED ORDER — LIDOCAINE 1 % OPTIME INJ - NO CHARGE
5.0000 mL | Freq: Once | INTRAMUSCULAR | Status: AC
  Administered 2024-04-18: 5 mL
  Filled 2024-04-18: qty 6

## 2024-04-18 MED ORDER — LIDOCAINE-EPINEPHRINE 1 %-1:100000 IJ SOLN
10.0000 mL | Freq: Once | INTRAMUSCULAR | Status: AC
  Administered 2024-04-18: 10 mL
  Filled 2024-04-18: qty 10

## 2024-04-18 NOTE — Progress Notes (Signed)
 Spoke with Sharon Cooper and her son Sharon Cooper.   They are having a hard time getting Sharon Cooper out of the house to her appointments and request the consultation with Dr. Lenn be canceled.  Appointment with Dr. Jacobo changed to [facility] video visit.

## 2024-04-21 LAB — SURGICAL PATHOLOGY

## 2024-04-21 NOTE — Progress Notes (Unsigned)
 " [facility]  Telephone:(336) [phone] Fax:(336) [phone]  ID: Sharon Cooper OB: [birth year]  MR#: [id number]  RDW#:[id number]  Patient Care Team: Sharma Coyer, MD as PCP - General (Family Medicine) Pa, [facility] (Optometry) Hooten, Lynwood SQUIBB, MD (Orthopedic Surgery) Georgina Shasta POUR, RN as Oncology Nurse Navigator  CHIEF COMPLAINT: Likely stage IV breast cancer.  INTERVAL HISTORY: Patient is a 89 year old female who recently was evaluated in the emergency room with multiple lesions on her right breast consistent with progressive breast cancer.  Patient stated she noticed this lesion several months before, but did not mention to anybody in her family.  She has increased back pain, but otherwise feels well.  She has no neurologic complaints.  She denies any recent fevers.  She has a fair appetite, but denies weight loss.  She has no chest pain, shortness of breath, cough, or hemoptysis.  She denies any nausea, vomiting, constipation, or diarrhea.  She has no urinary complaints.  Patient offers no further specific complaints today. CT of chest and abdomen performed in ED on 04/11/24 with further review an incidental finding of a couple of nonocclusive distal segmental and subsegmental pulmonary emboli within the posterior right lower lobe were seen.  Patient seen today in symptom management with her Son present to discuss these findings and treatment options.  Risk vs. Benefit explained to them both of treatment with anticoagulation vs. No treatment.  We discussed that anticoagulation would put her at increased bleeding risk.  Patient and her Son reports that she does fall often.  Due to her age, poor balance, and history of frequent falls she opted to hold of on anticoagulation at this time.  I did advise them that she is at risk for additional emboli that could be fatal or cause symptoms, they verbalize understanding.  REVIEW OF SYSTEMS:   Review of Systems   Constitutional: Negative.  Negative for fever, malaise/fatigue and weight loss.  Respiratory: Negative.  Negative for cough, hemoptysis and shortness of breath.   Cardiovascular: Negative.  Negative for chest pain and leg swelling.  Gastrointestinal: Negative.  Negative for abdominal pain.  Genitourinary: Negative.  Negative for dysuria.  Musculoskeletal:  Positive for back pain.  Skin: Negative.  Negative for rash.  Neurological: Negative.  Negative for dizziness, focal weakness, weakness and headaches.  Psychiatric/Behavioral: Negative.  The patient is not nervous/anxious.     As per HPI. Otherwise, a complete review of systems is negative.  PAST MEDICAL HISTORY: Past Medical History:  Diagnosis Date   Cancer (HCC)    Right kidney removed   Hyperlipidemia    Hypertension     PAST SURGICAL HISTORY: Past Surgical History:  Procedure Laterality Date   BREAST BIOPSY Right 04/18/2024   US  RT BREAST BX W LOC DEV 1ST LESION IMG BX SPEC US  GUIDE 04/18/2024 Anice Norleen JONELLE PONCE, MD [facility]   CARPAL TUNNEL RELEASE     KNEE SURGERY Right    NEPHRECTOMY Right    metastatic cancer   NEPHRECTOMY      FAMILY HISTORY: Family History  Problem Relation Age of Onset   Diabetes Son     ADVANCED DIRECTIVES (Y/N):  N    Current Outpatient Medications  Medication Sig Dispense Refill   Ascorbic Acid (VITAMIN C) 100 MG tablet Take 100 mg by mouth daily.     aspirin EC 81 MG tablet Take 81 mg by mouth daily.     Cholecalciferol (VITAMIN D-3) 1000 UNITS CAPS Take 1 capsule by  mouth daily.     cyanocobalamin 100 MCG tablet Take 1 tablet by mouth daily.     dexamethasone  (DECADRON ) 1 MG tablet Take 1 mg by mouth 2 (two) times daily.     lisinopril  (ZESTRIL ) 40 MG tablet TAKE 1 TABLET BY MOUTH ONCE DAILY 30 tablet 0   metoprolol  succinate (TOPROL -XL) 25 MG 24 hr tablet Take 0.5 tablets (12.5 mg total) by mouth daily. 45 tablet 1   Omega-3 Fatty Acids (FISH OIL BURP-LESS) 1000 MG CAPS  Take 1 tablet by mouth daily.     pravastatin  (PRAVACHOL ) 40 MG tablet Take 1 tablet (40 mg total) by mouth daily. 90 tablet 1   traMADol  (ULTRAM ) 50 MG tablet Take 1 tablet (50 mg total) by mouth every 6 (six) hours as needed. 30 tablet 0   acetaminophen  (TYLENOL ) 325 MG tablet Take 2 tablets (650 mg total) by mouth every 6 (six) hours as needed. (Patient not taking: Reported on 04/18/2024)     No current facility-administered medications for this visit.    OBJECTIVE: Vitals:   04/18/24 1345  BP: 118/72  Pulse: 85  Resp: 16  Temp: (!) 97.5 F (36.4 C)  SpO2: 100%     Body mass index is 27.64 kg/m.    ECOG FS:2 - Symptomatic, <50% confined to bed  General: Well-developed, well-nourished, no acute distress. Eyes: Pink conjunctiva, anicteric sclera. HEENT: Normocephalic, moist mucous membranes. Lungs: No audible wheezing or coughing. Heart: Regular rate and rhythm. Abdomen: Soft, nontender, no obvious distention. Musculoskeletal: No edema, cyanosis, or clubbing. Neuro: Alert, answering all questions appropriately. Cranial nerves grossly intact. Skin: No rashes or petechiae noted. Psych: Normal affect. Lymphatics: No cervical, calvicular, axillary or inguinal LAD.   LAB RESULTS:  Lab Results  Component Value Date   NA 138 04/11/2024   K 4.3 04/11/2024   CL 104 04/11/2024   CO2 19 (L) 04/11/2024   GLUCOSE 109 (H) 04/11/2024   BUN 20 04/11/2024   CREATININE 1.02 (H) 04/11/2024   CALCIUM  10.3 04/11/2024   PROT 6.4 08/22/2023   ALBUMIN 4.0 08/22/2023   AST 13 08/22/2023   ALT 7 08/22/2023   ALKPHOS 93 08/22/2023   BILITOT 0.5 08/22/2023   GFRNONAA 52 (L) 04/11/2024   GFRAA 46 (L) 04/08/2020    Lab Results  Component Value Date   WBC 7.8 04/11/2024   NEUTROABS 5.6 04/08/2020   HGB 14.0 04/11/2024   HCT 42.7 04/11/2024   MCV 92.4 04/11/2024   PLT 168 04/11/2024     STUDIES: US  LIMITED ULTRASOUND INCLUDING AXILLA RIGHT BREAST Result Date: 04/18/2024 CLINICAL  DATA:  Suspected stage IV RIGHT breast cancer with osseous metastases NOTE: US  accessions were meant to be included in original diagnostic mammogram report as one report. That report is copy and pasted here. EXAM: ULTRASOUND RIGHT BREAST LIMITED; ULTRASOUND LEFT BREAST LIMITED TECHNIQUE: Targeted ultrasound examination of the right breast was performed; Targeted ultrasound examination of the left breast was performed. COMPARISON:  Previous exam(s). ACR Breast Density Category c: The breasts are heterogeneously dense, which may obscure small masses. FINDINGS: Significantly limited examination due to patient has limited mobility and wheelchair-bound status. Best images possible obtained with 2 technologists positioning. Apparent mild LEFT breast skin thickening improves with repeat CC image and is favored to be due to slight under compression and positioning. Ultrasound was performed of the inferior and MEDIAL LEFT breast as a precaution. Benign secretory calcifications are seen bilaterally. 8-9 cm global asymmetry in the RIGHT breast, corresponding with known mass  seen on recent CT and with the palpable abnormality felt by the patient. There is invasion of the overlying skin, which appears thickened and nodular, with several discrete skin metastases visible mammographically. Dystrophic calcifications are present centrally within the RIGHT breast. Vascular calcifications are seen bilaterally. LEFT breast ultrasound was performed in the region of apparent skin thickening. On physical examination, there is no apparent skin thickening. Ultrasound in this region is unremarkable without evidence of marked skin thickening or other abnormality. On physical examination of the RIGHT breast there is a protruding, firm palpable abnormality involving much of the LATERAL breast. It directly invades the skin, eroding the nipple. Multiple raised erythematous skin papules consistent with skin metastases are seen. Several oozing sores  are seen. These open sores as well as the size and firmness of the palpable mass somewhat limit sonographic evaluation. Evaluation of the RIGHT axilla demonstrates an irregular, hypoechoic mass with possible fatty hilum, likely a replaced lymph node, measuring 13 x 21 x 18 mm. Additional enlarged axillary lymph nodes are noted in the deep RIGHT axilla, poorly visualized but correlating with findings on recent CT. In the low RIGHT axilla there is a large, irregular hypoechoic mass measuring 32 x 32 x 36 mm. Surrounding satellite lesions are present. Representative images of a palpable skin nodule at the RIGHT breast 12 o'clock position measuring 9 x 6 x 10 mm were taken. Numerous such lesions are present throughout the RIGHT breast. Given nipple erosion, exact distance from the nipple was challenging to assess. There is diffuse skin thickening and edema throughout the breast. Representative pictures were taken in the lower inner and upper inner quadrants. At the RIGHT breast 9:30 o'clock position is the large, palpable mass eroding through the skin and nipple. Due to its large size (8-9 cm on CT), as well as open sores overlying much of the mass, it is challenging to images in its entirety. It measures at least 5.6 x 4.3 x 5.3 cm. This correlates with the global asymmetry in the RIGHT breast. This mass is labeled 9 o'clock 8 cm from nipple on subsequent biopsy. IMPRESSION: 1. RIGHT breast mass measuring 8-9 cm which erodes through the skin and nipple, with evidence of multiple skin metastases, additional RIGHT breast masses, and RIGHT axillary lymphadenopathy. Findings are consistent with multicentric RIGHT breast cancer with skin involvement and axillary nodal metastases. Ultrasound-guided biopsy of the primary mass was subsequently performed and is separately dictated. After discussion with the patient's oncologist Dr. Jacobo, given suspected distant metastases, only the primary mass was biopsied as additional  biopsies would not impact the clinical plan. 2. No mammographic or sonographic evidence of malignancy in the LEFT breast. RECOMMENDATION: Ultrasound-guided biopsy of the RIGHT breast x1. I have discussed the findings and recommendations with the patient. The recommended procedure was discussed with the patient and questions were answered. Patient expressed their understanding of the recommendation. Patient will be scheduled for the procedure at her earliest convenience by the schedulers. Ordering provider will be notified. If applicable, a reminder letter will be sent to the patient regarding the next appointment. BI-RADS CATEGORY  5: Highly suggestive of malignancy. Electronically Signed   By: Norleen Croak M.D.   On: 04/18/2024 16:50   US  LIMITED ULTRASOUND INCLUDING AXILLA LEFT BREAST  Result Date: 04/18/2024 CLINICAL DATA:  Suspected stage IV RIGHT breast cancer with osseous metastases NOTE: US  accessions were meant to be included in original diagnostic mammogram report as one report. That report is copy and pasted here. EXAM: ULTRASOUND  RIGHT BREAST LIMITED; ULTRASOUND LEFT BREAST LIMITED TECHNIQUE: Targeted ultrasound examination of the right breast was performed; Targeted ultrasound examination of the left breast was performed. COMPARISON:  Previous exam(s). ACR Breast Density Category c: The breasts are heterogeneously dense, which may obscure small masses. FINDINGS: Significantly limited examination due to patient has limited mobility and wheelchair-bound status. Best images possible obtained with 2 technologists positioning. Apparent mild LEFT breast skin thickening improves with repeat CC image and is favored to be due to slight under compression and positioning. Ultrasound was performed of the inferior and MEDIAL LEFT breast as a precaution. Benign secretory calcifications are seen bilaterally. 8-9 cm global asymmetry in the RIGHT breast, corresponding with known mass seen on recent CT and with the  palpable abnormality felt by the patient. There is invasion of the overlying skin, which appears thickened and nodular, with several discrete skin metastases visible mammographically. Dystrophic calcifications are present centrally within the RIGHT breast. Vascular calcifications are seen bilaterally. LEFT breast ultrasound was performed in the region of apparent skin thickening. On physical examination, there is no apparent skin thickening. Ultrasound in this region is unremarkable without evidence of marked skin thickening or other abnormality. On physical examination of the RIGHT breast there is a protruding, firm palpable abnormality involving much of the LATERAL breast. It directly invades the skin, eroding the nipple. Multiple raised erythematous skin papules consistent with skin metastases are seen. Several oozing sores are seen. These open sores as well as the size and firmness of the palpable mass somewhat limit sonographic evaluation. Evaluation of the RIGHT axilla demonstrates an irregular, hypoechoic mass with possible fatty hilum, likely a replaced lymph node, measuring 13 x 21 x 18 mm. Additional enlarged axillary lymph nodes are noted in the deep RIGHT axilla, poorly visualized but correlating with findings on recent CT. In the low RIGHT axilla there is a large, irregular hypoechoic mass measuring 32 x 32 x 36 mm. Surrounding satellite lesions are present. Representative images of a palpable skin nodule at the RIGHT breast 12 o'clock position measuring 9 x 6 x 10 mm were taken. Numerous such lesions are present throughout the RIGHT breast. Given nipple erosion, exact distance from the nipple was challenging to assess. There is diffuse skin thickening and edema throughout the breast. Representative pictures were taken in the lower inner and upper inner quadrants. At the RIGHT breast 9:30 o'clock position is the large, palpable mass eroding through the skin and nipple. Due to its large size (8-9 cm on  CT), as well as open sores overlying much of the mass, it is challenging to images in its entirety. It measures at least 5.6 x 4.3 x 5.3 cm. This correlates with the global asymmetry in the RIGHT breast. This mass is labeled 9 o'clock 8 cm from nipple on subsequent biopsy. IMPRESSION: 1. RIGHT breast mass measuring 8-9 cm which erodes through the skin and nipple, with evidence of multiple skin metastases, additional RIGHT breast masses, and RIGHT axillary lymphadenopathy. Findings are consistent with multicentric RIGHT breast cancer with skin involvement and axillary nodal metastases. Ultrasound-guided biopsy of the primary mass was subsequently performed and is separately dictated. After discussion with the patient's oncologist Dr. Jacobo, given suspected distant metastases, only the primary mass was biopsied as additional biopsies would not impact the clinical plan. 2. No mammographic or sonographic evidence of malignancy in the LEFT breast. RECOMMENDATION: Ultrasound-guided biopsy of the RIGHT breast x1. I have discussed the findings and recommendations with the patient. The recommended procedure was discussed  with the patient and questions were answered. Patient expressed their understanding of the recommendation. Patient will be scheduled for the procedure at her earliest convenience by the schedulers. Ordering provider will be notified. If applicable, a reminder letter will be sent to the patient regarding the next appointment. BI-RADS CATEGORY  5: Highly suggestive of malignancy. Electronically Signed   By: Norleen Croak M.D.   On: 04/18/2024 16:50   MM DIAG BREAST TOMO BILATERAL Result Date: 04/18/2024 CLINICAL DATA:  Suspected stage IV RIGHT breast cancer with osseous metastases EXAM: DIGITAL DIAGNOSTIC BILATERAL MAMMOGRAM WITH TOMOSYNTHESIS AND CAD TECHNIQUE: Bilateral digital diagnostic mammography and breast tomosynthesis was performed. The images were evaluated with computer-aided detection.  COMPARISON:  Previous exam(s). ACR Breast Density Category c: The breasts are heterogeneously dense, which may obscure small masses. FINDINGS: Significantly limited examination due to patient has limited mobility and wheelchair-bound status. Best images possible obtained with 2 technologists positioning. Apparent mild LEFT breast skin thickening improves with repeat CC image and is favored to be due to slight under compression and positioning. Ultrasound was performed of the inferior and MEDIAL LEFT breast as a precaution. Benign secretory calcifications are seen bilaterally. 8-9 cm global asymmetry in the RIGHT breast, corresponding with known mass seen on recent CT and with the palpable abnormality felt by the patient. There is invasion of the overlying skin, which appears thickened and nodular, with several discrete skin metastases visible mammographically. Dystrophic calcifications are present centrally within the RIGHT breast. Vascular calcifications are seen bilaterally. LEFT breast ultrasound was performed in the region of apparent skin thickening. On physical examination, there is no apparent skin thickening. Ultrasound in this region is unremarkable without evidence of marked skin thickening or other abnormality. On physical examination of the RIGHT breast there is a protruding, firm palpable abnormality involving much of the LATERAL breast. It directly invades the skin, eroding the nipple. Multiple raised erythematous skin papules consistent with skin metastases are seen. Several oozing sores are seen. These open sores as well as the size and firmness of the palpable mass somewhat limit sonographic evaluation. Evaluation of the RIGHT axilla demonstrates an irregular, hypoechoic mass with possible fatty hilum, likely a replaced lymph node, measuring 13 x 21 x 18 mm. Additional enlarged axillary lymph nodes are noted in the deep RIGHT axilla, poorly visualized but correlating with findings on recent CT. In  the low RIGHT axilla there is a large, irregular hypoechoic mass measuring 32 x 32 x 36 mm. Surrounding satellite lesions are present. Representative images of a palpable skin nodule at the RIGHT breast 12 o'clock position measuring 9 x 6 x 10 mm were taken. Numerous such lesions are present throughout the RIGHT breast. Given nipple erosion, exact distance from the nipple was challenging to assess. There is diffuse skin thickening and edema throughout the breast. Representative pictures were taken in the lower inner and upper inner quadrants. At the RIGHT breast 9:30 o'clock position is the large, palpable mass eroding through the skin and nipple. Due to its large size (8-9 cm on CT), as well as open sores overlying much of the mass, it is challenging to images in its entirety. It measures at least 5.6 x 4.3 x 5.3 cm. This correlates with the global asymmetry in the RIGHT breast. This mass is labeled 9 o'clock 8 cm from nipple on subsequent biopsy. IMPRESSION: 1. RIGHT breast mass measuring 8-9 cm which erodes through the skin and nipple, with evidence of multiple skin metastases, additional RIGHT breast masses, and RIGHT  axillary lymphadenopathy. Findings are consistent with multicentric RIGHT breast cancer with skin involvement and axillary nodal metastases. Ultrasound-guided biopsy of the primary mass was subsequently performed and is separately dictated. After discussion with the patient's oncologist Dr. Jacobo, given suspected distant metastases, only the primary mass was biopsied as additional biopsies would not impact the clinical plan. 2. No mammographic or sonographic evidence of malignancy in the LEFT breast. RECOMMENDATION: Ultrasound-guided biopsy of the RIGHT breast x1. I have discussed the findings and recommendations with the patient. The recommended procedure was discussed with the patient and questions were answered. Patient expressed their understanding of the recommendation. Patient will be  scheduled for the procedure at her earliest convenience by the schedulers. Ordering provider will be notified. If applicable, a reminder letter will be sent to the patient regarding the next appointment. BI-RADS CATEGORY  5: Highly suggestive of malignancy. Electronically Signed   By: Norleen Croak M.D.   On: 04/18/2024 13:53   US  RT BREAST BX W LOC DEV 1ST LESION IMG BX SPEC US  GUIDE Result Date: 04/18/2024 CLINICAL DATA:  BI-RADS 5 RIGHT breast mass EXAM: ULTRASOUND GUIDED RIGHT BREAST CORE NEEDLE BIOPSY COMPARISON:  Previous exam(s). PROCEDURE: I met with the patient and we discussed the procedure of ultrasound-guided biopsy, including benefits and alternatives. We discussed the high likelihood of a successful procedure. We discussed the risks of the procedure, including infection, bleeding, tissue injury, clip migration, and inadequate sampling. Informed written consent was given. The usual time-out protocol was performed immediately prior to the procedure. Lesion quadrant: Lower outer Using sterile technique and 1% lidocaine  and 1% lidocaine  with epinephrine  as local anesthetic, under direct ultrasound visualization, a 12 gauge spring-loaded device was used to perform biopsy of the RIGHT breast mass at 9 o'clock 8 cm from the nipple using a radial approach. At the conclusion of the procedure [facility] coil shaped tissue marker clip was deployed into the biopsy cavity. A sonographically visible clip was selected and no post biopsy mammogram was performed due to difficulty of performing a mammogram in the setting of patient's mobility restrictions. IMPRESSION: Ultrasound guided biopsy of the RIGHT breast mass at 9 o'clock 8 cm from the nipple. No apparent complications. Electronically Signed   By: Norleen Croak M.D.   On: 04/18/2024 13:39   CT CHEST ABDOMEN PELVIS W CONTRAST Addendum Date: 04/18/2024 ADDENDUM #1 - ADDENDUM: On second review, there are a couple of nonocclusive distal segmental and subsegmental  pulmonary emboli within the posterior right lower lobe (for example, axial 41). Critical value/emergent results were called by telephone at 11:42 am on 04/18/2024 to Dr. Evalene Jacobo, who verbally acknowledged these results. ---------------------------------------------------- Electronically signed by: Rogelia Myers MD 04/18/2024 11:54 AM EST RP Workstation: [id number]   Result Date: 04/18/2024 ORIGINAL REPORT - EXAM: CT CHEST, ABDOMEN AND PELVIS WITH CONTRAST 04/11/2024 05:10:44 PM TECHNIQUE: CT of the chest, abdomen and pelvis was performed with the administration of 100 mL of iohexol  (OMNIPAQUE ) 300 MG/ML solution. Multiplanar reformatted images are provided for review. Automated exposure control, iterative reconstruction, and/or weight based adjustment of the mA/kV was utilized to reduce the radiation dose to as low as reasonably achievable. COMPARISON: 10/05/2012 CLINICAL HISTORY: Lymphadenopathy, groin. FINDINGS: CHEST: MEDIASTINUM AND LYMPH NODES: Heart and pericardium are unremarkable. Posterior coronary atherosclerosis. Moderate sized hiatal hernia. Subcarinal lymph node measuring 1.3 cm. Enlarged right retropectoral lymph node measuring 1.5 cm. The central airways are clear. No mediastinal, hilar or axillary lymphadenopathy. LUNGS AND PLEURA: Calcifications are seen in the posterior left and right  sides of the right lung bases as well as in the posterior left and right lung bases. Mosaic attenuation throughout the lungs, possibly reflecting chronic small airways disease. Posterior right lower lobe lung nodule measuring 1.1 cm (axial 130). Subsegmental atelectasis or scarring along the anterolateral left lower lobe. No focal consolidation or pulmonary edema. No pleural effusion. No pneumothorax. ABDOMEN AND PELVIS: LIVER: Unremarkable. GALLBLADDER AND BILE DUCTS: Unremarkable. No biliary ductal dilatation. SPLEEN: No acute abnormality. PANCREAS: 2.5 cm cystic structure in the uncinate process of the  pancreas, possibly a pseudocyst or side branch intraductal papillary mucinous neoplasm. ADRENAL GLANDS: No acute abnormality. KIDNEYS, URETERS AND BLADDER: Multiple left sided parapelvic renal cysts. Right nephrectomy. No stones in the kidneys or ureters. No hydronephrosis. No perinephric or periureteral stranding. The urinary bladder is distended without focal abnormality. GI AND BOWEL: Stomach demonstrates no acute abnormality. Extensive sigmoid colonic diverticulosis. No changes of acute diverticulitis. Decompressed normal appendix, best visualized on the coronal images. There is no bowel obstruction. REPRODUCTIVE ORGANS: Age related atrophy of the uterus and ovaries. PERITONEUM AND RETROPERITONEUM: No ascites. No free air. No free pelvic fluid. VASCULATURE: Aorta is normal in caliber. Diffuse aortoiliac atherosclerosis. ABDOMINAL AND PELVIS LYMPH NODES: No lymphadenopathy. BONES AND SOFT TISSUES: Skin thickening of the lateral right breast with a large centrally hypodense breast mass measuring 6.2 x 4.8 cm. There is a separate breast mass in the axillary tail region measuring 3.7 x 4.1 cm. Severe degenerative changes of both glenohumeral joints with large joint effusions, more so on the right than the left. Diffuse osteopenia. Advanced multilevel degenerative disc disease worst in the lumbar spine. Mild grade 1 anterolisthesis of L3 on L4 with retrolisthesis of L1 on L2 and L2 on L3. Bony sclerosis in the T6 and T7 vertebral bodies, worrisome for bony metastatic disease. There is also a bony sclerotic lesion in the S2 vertebral body, likely metastatic. 2.6 cm sclerotic lesion in the right iliac wing is also likely metastatic. Bony destructive lesion in the right pedicle of L5 causing significant mass effect and narrowing of the right neural foramen and spinal canal at this level (axial 87). Healing left lateral rib fracture of the 6th rib. Severe right hip osteoarthritis. No acute osseous abnormality. No focal  soft tissue abnormality. IMPRESSION: 1. A couple of enhancing masses noted in the right breast. The largest is a centrally hypodense mass in the retroareolar region measuring 6.2x4.8 cm, consistent with a primary breast malignancy. 2. Enlarged right retropectoral lymph node measuring 1.5 cm and subcarinal lymph node measuring 1.3 cm consistent with metastatic disease. 3. Multifocal osseous metastatic disease, including a bony destructive lesion in the right pedicle of L5 causing significant mass effect and narrowing of the right neural foramen and spinal canal at this level. 4. Extensive sigmoid colonic diverticulosis. No changes of acute diverticulitis. 5. 1.1 cm posterior right lower lobe nodule (axial 130) concerning for metastatic disease in the lung. Electronically signed by: Rogelia Myers MD 04/11/2024 06:18 PM EST RP Workstation: [id number]   MR LUMBAR SPINE WO CONTRAST Result Date: 04/11/2024 EXAM: MRI LUMBAR SPINE 04/11/2024 07:31:24 PM TECHNIQUE: Multiplanar multisequence MRI of the lumbar spine was performed without the administration of intravenous contrast. COMPARISON: None available. CLINICAL HISTORY: Bone mass or bone pain, lumbar spine, aggressive features on xray. Abnormal CT scan. Osseous metastases. Known breast cancer primary. FINDINGS: BONES AND ALIGNMENT: 2 mm retrolisthesis is present at L1-L2 and L2-L3. Grade 1 anterolisthesis at L3-L4 measures 5 mm. Grade 1 anterolisthesis at L4-L5 measures 8  mm. Normal vertebral body heights. A 1.5 cm sclerotic lesion is present along the anterior inferior aspect of L2, appearing contiguous with the L3 lesion. Bone marrow signal demonstrates a sclerotic lesion filling the majority of the L3 vertebral body. A lesion is present within the right pedicle of L4 ascending into the posterior elements with expansion of the right lamina. Diffuse metastatic infiltration is present at L5 with extension through both pedicles and involvement of the posterior  elements, right greater than left. A 2 cm metastasis is present in the right sacral ala. A 2 cm mass in the right lamina of S1 contributes to moderate right central canal stenosis at S1-S2. SPINAL CORD: The conus medullaris terminates at T12-L1. SOFT TISSUES: No paraspinal mass. L1-L2: Rightward disc protrusion and moderate right foraminal stenosis. L2-L3: A broad based disc protrusion and advanced facet hypertrophy result in severe central canal stenosis and moderate bilateral foraminal narrowing. L3-L4: Uncovering of a broad based disc protrusion and advanced facet hypertrophy result in severe central and bilateral foraminal stenosis. L4-L5: Broad based disc protrusion and advanced facet hypertrophy result in severe central and moderate bilateral foraminal stenosis. L5-S1: A broad based disc protrusion contributes to moderate to severe central canal stenosis. Severe right and moderate left foraminal stenosis is present. IMPRESSION: 1. Osseous metastases involving the lumbar spine, including L2, L3, L4, L5, S1, and the right sacral ala. 2. Moderate right central canal stenosis at S1-S2 related to metastatic involvement of the right S1 posterior elements. 3. Severe central canal stenosis at L2-L3, L3-L4, and L4-L5 levels. 4. Moderate to severe central canal stenosis at L5-S1. 5. Severe bilateral foraminal stenosis at L3-L4. 6. Severe right foraminal stenosis at L5-S1. Electronically signed by: Lonni Necessary MD 04/11/2024 08:03 PM EST RP Workstation: [id number]   CT HEAD WO CONTRAST ( ) Result Date: 04/11/2024 EXAM: CT HEAD WITHOUT CONTRAST 04/11/2024 05:10:44 PM TECHNIQUE: CT of the head was performed without the administration of intravenous contrast. Automated exposure control, iterative reconstruction, and/or weight based adjustment of the mA/kV was utilized to reduce the radiation dose to as low as reasonably achievable. COMPARISON: Head CT without contrast 10/05/2012. CLINICAL HISTORY: Headache, new  onset (Age >= 53y). FINDINGS: BRAIN AND VENTRICLES: Progressive atrophy and white matter changes are present bilaterally. Moderate calcific atheromatous disease within carotid siphons and vertebral arteries. No acute hemorrhage. No evidence of acute infarct. No hydrocephalus. No extra-axial collection. No mass effect or midline shift. ORBITS: No acute abnormality. SINUSES: No acute abnormality. SOFT TISSUES AND SKULL: Hyperostosis frontalis interna. Degenerative changes at atlantooccipital joints. No acute soft tissue abnormality. No skull fracture. IMPRESSION: 1. No acute intracranial abnormality. Electronically signed by: Lonni Necessary MD 04/11/2024 05:34 PM EST RP Workstation: [id number]    ASSESSMENT: Likely stage IV breast cancer.  Incidental findings of pulmonary emboli on chest CT  PLAN:    Pulmonary emboli: asymptomatic incidental finding on CT abdomen and chest.  Treatment with anticoagulation reviewed with patient and her Son.  Risk vs. Benefit explained.  Patient does not wish to be treated with anticoagulation at this time.  She feels that due to her age, history of frequent falls it would be more harmful than beneficial to take anticoagulation.  Her and her son verbalizes understanding that she is at risk of development of other emboli in the future that could be fatal.  Hold of on anticoagulation at this time.  Follow up as previously scheduled with Dr. Jacobo.     Patient expressed understanding and was in agreement with this plan.  She also understands that She can call clinic at any time with any questions, concerns, or complaints.    Cancer Staging  No matching staging information was found for the patient.   Morna Husband, NP   04/21/2024 11:38 PM       "

## 2024-04-28 ENCOUNTER — Ambulatory Visit: Admitting: Radiation Oncology

## 2024-04-28 ENCOUNTER — Inpatient Hospital Stay: Admitting: Oncology

## 2024-04-28 ENCOUNTER — Other Ambulatory Visit: Payer: Self-pay | Admitting: Family Medicine

## 2024-04-28 ENCOUNTER — Inpatient Hospital Stay: Attending: Oncology | Admitting: Oncology

## 2024-04-28 DIAGNOSIS — E7800 Pure hypercholesterolemia, unspecified: Secondary | ICD-10-CM

## 2024-04-28 DIAGNOSIS — I10 Essential (primary) hypertension: Secondary | ICD-10-CM

## 2024-04-28 DIAGNOSIS — C50911 Malignant neoplasm of unspecified site of right female breast: Secondary | ICD-10-CM | POA: Insufficient documentation

## 2024-04-28 MED ORDER — TRAMADOL HCL 50 MG PO TABS: 50.0000 mg | ORAL_TABLET | Freq: Four times a day (QID) | ORAL | 1 refills | Status: AC | PRN

## 2024-04-29 ENCOUNTER — Encounter: Payer: Self-pay | Admitting: *Deleted

## 2024-04-29 ENCOUNTER — Telehealth: Payer: Self-pay

## 2024-04-29 DIAGNOSIS — C50911 Malignant neoplasm of unspecified site of right female breast: Secondary | ICD-10-CM

## 2024-04-29 NOTE — Telephone Encounter (Signed)
-----   Message from Evalene Reusing, MD sent at 04/28/2024  3:32 PM EST ----- Regarding: Hospice Referal Patient will require a referral to [facility] when clinic reopens on Tuesday.  Please have them call and coordinate with patient's son, Rosalva, who is listed as her emergency contact.  Thanks!

## 2024-04-29 NOTE — Telephone Encounter (Signed)
 Hospice referral entered and submitted electronically.

## 2024-05-16 ENCOUNTER — Inpatient Hospital Stay: Admitting: Hospice and Palliative Medicine
